# Patient Record
Sex: Male | Born: 1940 | Race: White | Hispanic: No | Marital: Married | Smoking: Former smoker
Health system: Southern US, Community
[De-identification: ages and names within clinical notes are randomized; demographics above are authoritative.]

## PROBLEM LIST (undated history)

## (undated) DIAGNOSIS — G4700 Insomnia, unspecified: Secondary | ICD-10-CM

## (undated) DIAGNOSIS — T7840XA Allergy, unspecified, initial encounter: Secondary | ICD-10-CM

## (undated) DIAGNOSIS — I10 Essential (primary) hypertension: Secondary | ICD-10-CM

## (undated) DIAGNOSIS — N529 Male erectile dysfunction, unspecified: Secondary | ICD-10-CM

## (undated) HISTORY — PX: TONSILLECTOMY AND ADENOIDECTOMY: SHX28

## (undated) HISTORY — DX: Essential (primary) hypertension: I10

## (undated) HISTORY — DX: Male erectile dysfunction, unspecified: N52.9

## (undated) HISTORY — DX: Insomnia, unspecified: G47.00

## (undated) HISTORY — DX: Allergy, unspecified, initial encounter: T78.40XA

---

## 1969-05-25 HISTORY — PX: VASECTOMY: SHX75

## 1990-03-17 ENCOUNTER — Encounter: Payer: Self-pay | Admitting: Internal Medicine

## 2001-08-19 ENCOUNTER — Ambulatory Visit (HOSPITAL_BASED_OUTPATIENT_CLINIC_OR_DEPARTMENT_OTHER): Admission: RE | Admit: 2001-08-19 | Discharge: 2001-08-19 | Payer: Self-pay

## 2005-03-19 ENCOUNTER — Encounter: Admission: RE | Admit: 2005-03-19 | Discharge: 2005-03-19 | Payer: Self-pay | Admitting: Occupational Medicine

## 2009-12-06 DIAGNOSIS — I10 Essential (primary) hypertension: Secondary | ICD-10-CM | POA: Insufficient documentation

## 2013-12-29 LAB — BASIC METABOLIC PANEL
BUN: 13 mg/dL (ref 4–21)
Creatinine: 1.2 mg/dL (ref <=1.3)
GLUCOSE: 111 mg/dL
Potassium: 4.7 mmol/L (ref 3.4–5.3)
Sodium: 137 mmol/L (ref 137–147)

## 2013-12-29 LAB — PSA: PSA: 2.7

## 2013-12-29 LAB — HEMOGLOBIN A1C: Hgb A1c MFr Bld: 6 % (ref 4.0–6.0)

## 2015-01-01 ENCOUNTER — Other Ambulatory Visit: Payer: Self-pay | Admitting: Family Medicine

## 2015-01-01 ENCOUNTER — Telehealth: Payer: Self-pay | Admitting: Family Medicine

## 2015-01-01 MED ORDER — METOPROLOL TARTRATE 25 MG PO TABS
12.5000 mg | ORAL_TABLET | Freq: Every day | ORAL | Status: DC
Start: 2015-01-01 — End: 2015-01-01

## 2015-01-01 MED ORDER — METOPROLOL TARTRATE 25 MG PO TABS: 12.5000 mg | ORAL_TABLET | Freq: Every day | ORAL | Status: DC

## 2015-01-01 NOTE — Telephone Encounter (Signed)
Metoprolol refill  [facility]

## 2015-01-09 DIAGNOSIS — G4700 Insomnia, unspecified: Secondary | ICD-10-CM | POA: Insufficient documentation

## 2015-01-09 DIAGNOSIS — J309 Allergic rhinitis, unspecified: Secondary | ICD-10-CM | POA: Insufficient documentation

## 2015-01-09 DIAGNOSIS — I7100 Dissection of unspecified site of aorta: Secondary | ICD-10-CM | POA: Insufficient documentation

## 2015-01-09 DIAGNOSIS — N529 Male erectile dysfunction, unspecified: Secondary | ICD-10-CM | POA: Insufficient documentation

## 2015-01-09 DIAGNOSIS — R7303 Prediabetes: Secondary | ICD-10-CM | POA: Insufficient documentation

## 2015-01-11 ENCOUNTER — Ambulatory Visit (INDEPENDENT_AMBULATORY_CARE_PROVIDER_SITE_OTHER): Payer: Medicare Other | Admitting: Family Medicine

## 2015-01-11 ENCOUNTER — Encounter: Payer: Self-pay | Admitting: Family Medicine

## 2015-01-11 VITALS — BP 120/62 | HR 42 | Temp 97.6°F | Resp 16 | Ht 68.75 in | Wt 185.0 lb

## 2015-01-11 DIAGNOSIS — I10 Essential (primary) hypertension: Secondary | ICD-10-CM

## 2015-01-11 DIAGNOSIS — Z1211 Encounter for screening for malignant neoplasm of colon: Secondary | ICD-10-CM | POA: Diagnosis not present

## 2015-01-11 DIAGNOSIS — G4700 Insomnia, unspecified: Secondary | ICD-10-CM | POA: Diagnosis not present

## 2015-01-11 DIAGNOSIS — R7309 Other abnormal glucose: Secondary | ICD-10-CM | POA: Diagnosis not present

## 2015-01-11 DIAGNOSIS — Z0000 Encounter for general adult medical examination without abnormal findings: Secondary | ICD-10-CM

## 2015-01-11 DIAGNOSIS — Z125 Encounter for screening for malignant neoplasm of prostate: Secondary | ICD-10-CM

## 2015-01-11 DIAGNOSIS — R7303 Prediabetes: Secondary | ICD-10-CM

## 2015-01-11 MED ORDER — LISINOPRIL 20 MG PO TABS: 20.0000 mg | ORAL_TABLET | Freq: Every day | ORAL | Status: DC

## 2015-01-11 MED ORDER — TRIAMTERENE-HCTZ 75-50 MG PO TABS: 1.0000 | ORAL_TABLET | Freq: Every day | ORAL | Status: DC

## 2015-01-11 MED ORDER — ALPRAZOLAM 0.5 MG PO TABS: 0.5000 mg | ORAL_TABLET | Freq: Every day | ORAL | Status: DC

## 2015-01-11 MED ORDER — METOPROLOL TARTRATE 25 MG PO TABS: 12.5000 mg | ORAL_TABLET | Freq: Every day | ORAL | Status: DC

## 2015-01-11 NOTE — Progress Notes (Signed)
Patient: Hunter Fernandez, Male    DOB: 1940-12-11, 74 y.o.   MRN: [id number] Visit Date: 01/11/2015  Today's Provider: Lelon Huh, MD   Chief Complaint  Patient presents with  . Physical  . Hypertension    follow up  . Hyperglycemia    follow up  . Erectile Dysfunction    follow up   Subjective:    Physical  Hunter Fernandez is a 74 y.o. male. He feels well. He reports exercising  Twice a week golfing. He reports he is sleeping well.  -----------------------------------------------------------  Hypertension, follow-up:  BP Readings from Last 3 Encounters:  01/11/15 120/62    He was last seen for hypertension 1 years ago.  BP at that visit was  124/76. Management changes since that visit include  Changing to Metoprolol tartrate due to cost. He reports excellent compliance with treatment. He is not having side effects.  He is exercising. He is not adherent to low salt diet.   Outside blood pressures are  Not being checked. He is experiencing none.  Patient denies chest pain, chest pressure/discomfort, claudication, dyspnea, exertional chest pressure/discomfort, fatigue, irregular heart beat, lower extremity edema, near-syncope, orthopnea, palpitations, paroxysmal nocturnal dyspnea, syncope and tachypnea.   Cardiovascular risk factors include advanced age (older than 57 for men, 61 for women), hypertension and male gender.  Use of agents associated with hypertension: NSAIDS.     Weight trend: stable Wt Readings from Last 3 Encounters:  01/11/15 185 lb (83.915 kg)    Current diet: in general, an "unhealthy" diet  ------------------------------------------------------------------------  Hyperglycemia, Follow-up:   Lab Results  Component Value Date   HGBA1C 6.0 12/29/2013    Last seen for for this1 years ago.  Management changes included  none. Current symptoms include none and have been stable.  Weight trend: stable Prior visit with dietician:  no Current diet: in general, an "unhealthy" diet Current exercise: golf  Pertinent Labs:    Component Value Date/Time   CREATININE 1.2 12/29/2013    Wt Readings from Last 3 Encounters:  01/11/15 185 lb (83.915 kg)      Review of Systems  Constitutional: Negative for fever, chills, appetite change and fatigue.  HENT: Negative for congestion, ear pain, hearing loss, nosebleeds and trouble swallowing.   Eyes: Negative for pain and visual disturbance.  Respiratory: Negative for cough, chest tightness and shortness of breath.   Cardiovascular: Negative for chest pain, palpitations and leg swelling.  Gastrointestinal: Negative for nausea, vomiting, abdominal pain, diarrhea, constipation and blood in stool.  Endocrine: Negative for polydipsia, polyphagia and polyuria.  Genitourinary: Negative for dysuria and flank pain.  Musculoskeletal: Negative for myalgias, back pain, joint swelling, arthralgias and neck stiffness.  Skin: Negative for color change, rash and wound.  Neurological: Negative for dizziness, tremors, seizures, speech difficulty, weakness, light-headedness and headaches.  Psychiatric/Behavioral: Negative for behavioral problems, confusion, sleep disturbance, dysphoric mood and decreased concentration. The patient is not nervous/anxious.   All other systems reviewed and are negative.   Social History   Social History  . Marital Status: Married    Spouse Name: N/A  . Number of Children: 1  . Years of Education: N/A   Occupational History  . Retired    Social History Main Topics  . Smoking status: Former Research scientist (life sciences)  . Smokeless tobacco: Never Used     Comment: quit smoking in 1991  . Alcohol Use: Yes     Comment: occasional alcohol use  . Drug Use: No  .  Sexual Activity: Not on file   Other Topics Concern  . Not on file   Social History Narrative    Patient Active Problem List   Diagnosis Date Noted  . Allergic rhinitis 01/09/2015  . Aortic dissection  01/09/2015  . 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 Insomnia 01/09/2015  . Pre-diabetes 01/09/2015  . Essential (primary) hypertension 12/06/2009    Past Surgical History  Procedure Laterality Date  . Tonsillectomy and adenoidectomy  1940  . Vasectomy  1971    His family history includes CVA in his sister; Dementia in his mother.    Previous Medications   ALPRAZOLAM (XANAX) 0.5 MG TABLET    Take 1 tablet by mouth at bedtime.   ASPIRIN EC 81 MG TABLET    Take 1 tablet by mouth daily. 1 Every day   DIPHENHYDRAMINE (BENADRYL) 25 MG TABLET    Take 25 mg by mouth 2 (two) times daily.   FLUTICASONE (FLONASE) 50 MCG/ACT NASAL SPRAY    Place 2 sprays into the nose daily.   LISINOPRIL (PRINIVIL,ZESTRIL) 20 MG TABLET    Take 20 mg by mouth daily.   METOPROLOL TARTRATE (LOPRESSOR) 25 MG TABLET    Take 0.5 tablets (12.5 mg total) by mouth daily.   SILDENAFIL (REVATIO) 20 MG TABLET    Take 2-3 tablets by mouth as needed. No more than 5 in a day   TRIAMTERENE-HYDROCHLOROTHIAZIDE (MAXZIDE) 75-50 MG PER TABLET    Take 1 tablet by mouth daily.    Patient Care Team: Birdie Sons, MD as PCP - General (Family Medicine)     Objective:   Vitals: BP 120/62 mmHg  Pulse 42  Temp(Src) 97.6 F (36.4 C) (Oral)  Resp 16  Ht 5' 8.75" (1.746 m)  Wt 185 lb (83.915 kg)  BMI 27.53 kg/m2  SpO2 95%  Physical Exam   General Appearance:    Alert, cooperative, no distress, appears stated age  Head:    Normocephalic, without obvious abnormality, atraumatic  Eyes:    PERRL, conjunctiva/corneas clear, EOM's intact, fundi    benign, both eyes       Ears:    Normal TM's and external ear canals, both ears  Nose:   Nares normal, septum midline, mucosa normal, no drainage   or sinus tenderness  Throat:   Lips, mucosa, and tongue normal; teeth and gums normal  Neck:   Supple, symmetrical, trachea midline, no adenopathy;       thyroid:  No enlargement/tenderness/nodules; no carotid    bruit or JVD  Back:     Symmetric, no curvature, ROM normal, no CVA tenderness  Lungs:     Clear to auscultation bilaterally, respirations unlabored  Chest wall:    No tenderness or deformity  Heart:    Regular rate and rhythm, S1 and S2 normal, no murmur, rub   or gallop  Abdomen:     Soft, non-tender, bowel sounds active all four quadrants,    no masses, no organomegaly  Genitalia:    deferred  Rectal:    deferred  Extremities:   Extremities normal, atraumatic, no cyanosis or edema  Pulses:   2+ and symmetric all extremities  Skin:   Skin color, texture, turgor normal, no rashes or lesions  Lymph nodes:   Cervical, supraclavicular, and axillary nodes normal  Neurologic:   CNII-XII intact. Normal strength, sensation and reflexes      throughout    Activities of Daily Living In your present state of health,  do you have any difficulty performing the following activities: 01/11/2015  Hearing? N  Vision? N  Difficulty concentrating or making decisions? N  Walking or climbing stairs? N  Dressing or bathing? N  Doing errands, shopping? N    Fall Risk Assessment Fall Risk  01/11/2015  Falls in the past year? No     Depression Screen PHQ 2/9 Scores 01/11/2015  PHQ - 2 Score 0    Cognitive Testing - 6-CIT  Correct? Score   What year is it? yes 0 0 or 4  What month is it? yes 0 0 or 3  Memorize:    Pia Mau,  40,  [place],      What time is it? (within 1 hour) yes 0 0 or 3  Count backwards from 20 yes 0 0, 2, or 4  Name the months of the year yes 0 0, 2, or 4  Repeat name & address above no 8 0, 2, 4, 6, 8, or 10       TOTAL SCORE  8/28   Interpretation:  Abnormal- .  Normal (0-7) Abnormal (8-28)     Audit-C Alcohol Use Screening  Question Answer Points  How often do you have alcoholic drink? 2-3 times weekly 3  How many drinks do you typically consume in a day? 1-2 0  How oftey will you drink 6 or more in a total? never 0  Total Score:  3   A score of  3 or more in women, and 4 or more in men indicates increased risk for alcohol abuse, EXCEPT if all of the points are from question 1.   Assessment & Plan:     Annual Physical  Reviewed patient's Family Medical History Reviewed and updated list of patient's medical providers Assessment of cognitive impairment was done Assessed patient's functional ability Established a written schedule for health screening [place] Completed and Reviewed  Exercise Activities and Dietary recommendations Goals    None      Immunization History  Administered Date(s) Administered  . Pneumococcal Conjugate-13 12/29/2013  . Pneumococcal Polysaccharide-23 09/28/2012    Health Maintenance  Topic Date Due  . COLONOSCOPY  10/29/1988  . ZOSTAVAX  10/30/1998  . INFLUENZA VACCINE  12/24/2014  . PNA vac Low Risk Adult  Completed      Discussed health benefits of physical activity, and encouraged him to engage in regular exercise appropriate for his age and condition.    ------------------------------------------------------------------------------------------------------------ 1. Annual physical exam Generally doing well.   2. Essential (primary) hypertension well controlled.Reduce metoprolol due to bradycardia  - EKG 12-Lead - Renal function panel - Lipid panel - TSH - metoprolol tartrate (LOPRESSOR) 25 MG tablet; Take 0.5 tablets (12.5 mg total) by mouth daily.  Dispense: 45 tablet; Refill: 3  3. Insomnia Well controlled on alprazolam - ALPRAZolam (XANAX) 0.5 MG tablet; Take 1 tablet (0.5 mg total) by mouth at bedtime.  Dispense: 90 tablet; Refill: 1  4. Pre-diabetes  - Hemoglobin A1c  5. Prostate cancer screening  - PSA  6. Colon cancer screening Declines referral for colonoscopy.  - Cologuard

## 2015-01-12 LAB — LIPID PANEL
Chol/HDL Ratio: 4.2 ratio units (ref 0.0–5.0)
Cholesterol, Total: 179 mg/dL (ref 100–199)
HDL: 43 mg/dL (ref >39)
LDL Calculated: 101 mg/dL — ABNORMAL HIGH (ref 0–99)
TRIGLYCERIDES: 177 mg/dL — AB (ref 0–149)
VLDL Cholesterol Cal: 35 mg/dL (ref 5–40)

## 2015-01-12 LAB — RENAL FUNCTION PANEL
Albumin: 4.4 g/dL (ref 3.5–4.8)
BUN/Creatinine Ratio: 8 — ABNORMAL LOW (ref 10–22)
BUN: 11 mg/dL (ref 8–27)
CALCIUM: 9.9 mg/dL (ref 8.6–10.2)
CO2: 25 mmol/L (ref 18–29)
Chloride: 97 mmol/L (ref 97–108)
Creatinine, Ser: 1.31 mg/dL — ABNORMAL HIGH (ref 0.76–1.27)
GFR calc Af Amer: 61 mL/min/{1.73_m2} (ref >59)
GFR calc non Af Amer: 53 mL/min/{1.73_m2} — ABNORMAL LOW (ref >59)
Glucose: 107 mg/dL — ABNORMAL HIGH (ref 65–99)
Phosphorus: 2.6 mg/dL (ref 2.5–4.5)
Potassium: 5.4 mmol/L — ABNORMAL HIGH (ref 3.5–5.2)
Sodium: 138 mmol/L (ref 134–144)

## 2015-01-12 LAB — PSA: Prostate Specific Ag, Serum: 2.7 ng/mL (ref 0.0–4.0)

## 2015-01-12 LAB — TSH: TSH: 1.61 u[IU]/mL (ref 0.450–4.500)

## 2015-01-12 LAB — HEMOGLOBIN A1C
ESTIMATED AVERAGE GLUCOSE: 123 mg/dL
Hgb A1c MFr Bld: 5.9 % — ABNORMAL HIGH (ref 4.8–5.6)

## 2015-01-22 LAB — COLOGUARD: Cologuard: NEGATIVE

## 2015-05-01 ENCOUNTER — Telehealth: Payer: Self-pay | Admitting: Family Medicine

## 2015-05-01 ENCOUNTER — Other Ambulatory Visit: Payer: Self-pay | Admitting: *Deleted

## 2015-05-01 NOTE — Telephone Encounter (Signed)
Please advise patient that Colo guard test was negative. Repeat in 3 years.

## 2015-05-01 NOTE — Telephone Encounter (Signed)
Patient's wife Webb Silversmith was notified of results. Expressed understanding.

## 2015-05-01 NOTE — Telephone Encounter (Signed)
-----   Message from April Loleta Chance, [place] sent at 04/30/2015  3:45 PM EST ----- Regarding: RE: Cologuard results Results are being fax.   ----- Message -----    From: Birdie Sons, MD    Sent: 04/30/2015  11:58 AM      To: Hardy Harcum Nurse Subject: Cologuard results                              This patient states he collected and mailed in Cologuard in August, but I can't find results anywhere in the EMR. Can you call Cologuard and see if they can send copy of results. Thanks.

## 2015-07-08 ENCOUNTER — Other Ambulatory Visit: Payer: Self-pay | Admitting: Family Medicine

## 2015-07-08 NOTE — Telephone Encounter (Signed)
Please call in alprazolam.  

## 2015-07-08 NOTE — Telephone Encounter (Signed)
Rx called in to pharmacy. 

## 2016-01-01 ENCOUNTER — Other Ambulatory Visit: Payer: Self-pay | Admitting: Family Medicine

## 2016-01-01 DIAGNOSIS — I10 Essential (primary) hypertension: Secondary | ICD-10-CM

## 2016-01-29 DIAGNOSIS — L03311 Cellulitis of abdominal wall: Secondary | ICD-10-CM | POA: Diagnosis not present

## 2016-01-29 DIAGNOSIS — K651 Peritoneal abscess: Secondary | ICD-10-CM | POA: Diagnosis not present

## 2016-02-20 ENCOUNTER — Other Ambulatory Visit: Payer: Self-pay | Admitting: Family Medicine

## 2016-02-20 NOTE — Telephone Encounter (Signed)
Refill request for triamterene-HTCZ 75-50 mg qd Last filled by MD on- 01/11/2015 #90 x3 Last Appt: 01/11/2015 Next Appt: none Please advise refill?

## 2016-03-11 DIAGNOSIS — Z23 Encounter for immunization: Secondary | ICD-10-CM | POA: Diagnosis not present

## 2016-03-18 ENCOUNTER — Ambulatory Visit (INDEPENDENT_AMBULATORY_CARE_PROVIDER_SITE_OTHER): Payer: Medicare Other | Admitting: Family Medicine

## 2016-03-18 ENCOUNTER — Encounter: Payer: Self-pay | Admitting: Family Medicine

## 2016-03-18 VITALS — BP 140/74 | HR 52 | Temp 97.7°F | Resp 16 | Wt 186.0 lb

## 2016-03-18 DIAGNOSIS — M5416 Radiculopathy, lumbar region: Secondary | ICD-10-CM

## 2016-03-18 MED ORDER — PREDNISONE 10 MG PO TABS: ORAL_TABLET | ORAL | 0 refills | Status: AC

## 2016-03-18 MED ORDER — GABAPENTIN 300 MG PO CAPS: 300.0000 mg | ORAL_CAPSULE | Freq: Every day | ORAL | 0 refills | Status: DC

## 2016-03-18 NOTE — Progress Notes (Signed)
Patient: Hunter Fernandez Male    DOB: 1940/07/03   75 y.o.   MRN: [id number] Visit Date: 03/18/2016  Today's Provider: Lelon Huh, MD   Chief Complaint  Patient presents with  . Hip Pain  . Leg Pain   Subjective:    Hip Pain   Incident onset: 3-4 weeks ago. There was no injury mechanism. The pain is present in the left leg and left hip. The quality of the pain is described as shooting (sharp). The pain has been intermittent since onset. Exacerbated by: standing after prolonged sitting. He has tried NSAIDs for the symptoms. The treatment provided moderate relief.  Leg Pain   Incident onset: 3-4 weeks ago. There was no injury mechanism. The pain is present in the left leg and left hip. The quality of the pain is described as shooting (sharp). The pain has been intermittent since onset. He has tried NSAIDs for the symptoms.  Has taken 4 x 200mg  ibuprofen with minimal relief.      No Known Allergies   Current Outpatient Prescriptions:  .  ALPRAZolam (XANAX) 0.5 MG tablet, TAKE ONE TABLET AT BEDTIME, Disp: 90 tablet, Rfl: 3 .  aspirin EC 81 MG tablet, Take 1 tablet by mouth daily. 1 Every day, Disp: , Rfl:  .  diphenhydrAMINE (BENADRYL) 25 MG tablet, Take 25 mg by mouth 2 (two) times daily., Disp: , Rfl:  .  fluticasone (FLONASE) 50 MCG/ACT nasal spray, Place 2 sprays into the nose daily., Disp: , Rfl:  .  lisinopril (PRINIVIL,ZESTRIL) 20 MG tablet, Take 1 tablet (20 mg total) by mouth daily., Disp: 90 tablet, Rfl: 3 .  metoprolol tartrate (LOPRESSOR) 25 MG tablet, TAKE ONE-HALF TABLET DAILY, Disp: 45 tablet, Rfl: 12 .  Omega-3 Fatty Acids (FISH OIL PO), Take 1 capsule by mouth daily., Disp: , Rfl:  .  sildenafil (REVATIO) 20 MG tablet, Take 2-3 tablets by mouth as needed. No more than 5 in a day, Disp: , Rfl:  .  triamterene-hydrochlorothiazide (MAXZIDE) 75-50 MG tablet, TAKE 1 TABLET EVERY DAY USUALLY IN THE MORNING, Disp: 90 tablet, Rfl: 0  Review of Systems  Constitutional:  Negative for appetite change, chills and fever.  Respiratory: Negative for chest tightness, shortness of breath and wheezing.   Cardiovascular: Negative for chest pain, palpitations and leg swelling.  Gastrointestinal: Negative for abdominal pain, nausea and vomiting.  Musculoskeletal: Positive for arthralgias (left hip and leg). Negative for back pain.    Social History  Substance Use Topics  . Smoking status: Former Research scientist (life sciences)  . Smokeless tobacco: Never Used     Comment: quit smoking in 1991  . Alcohol use Yes     Comment: occasional alcohol use   Objective:   BP 140/74 (BP Location: Left Arm, Patient Position: Sitting, Cuff Size: Large)   Pulse (!) 52   Temp 97.7 F (36.5 C) (Oral)   Resp 16   Wt 186 lb (84.4 kg)   SpO2 93% Comment: room air  BMI 27.67 kg/m   Physical Exam  General appearance: alert, well developed, well nourished, cooperative and in no distress Head: Normocephalic, without obvious abnormality, atraumatic Respiratory: Respirations even and unlabored, normal respiratory rate Extremities: No gross deformities Skin: Skin color, texture, turgor normal. No rashes seen  Psych: Appropriate mood and affect. Neurologic: Mental status: Alert, oriented to person, place, and time, thought content appropriate. MS: No tenderness of greater trochanter. FROM RIGHT hip with no pain. FROM left hip with pain on full  flexion. Pain is right hip is reproduced with palpation of lumbar spine.     Assessment & Plan:     1. Lumbar radiculopathy C/w L4 radiculopathy. Start prednisone and gabapentin. He is to call if not greatly improved over the next 3-4 days.  - predniSONE (DELTASONE) 10 MG tablet; 6 tablets for 2 days, then 5 for 2 days, then 4 for 2 days, then 3 for 2 days, then 2 for 2 days, then 1 for 2 days.  Dispense: 42 tablet; Refill: 0 - gabapentin (NEURONTIN) 300 MG capsule; Take 1 capsule (300 mg total) by mouth at bedtime.  Dispense: 30 capsule; Refill: 0         Lelon Huh, MD  [facility]

## 2016-03-28 ENCOUNTER — Other Ambulatory Visit: Payer: Self-pay | Admitting: Family Medicine

## 2016-03-31 ENCOUNTER — Other Ambulatory Visit: Payer: Self-pay

## 2016-03-31 MED ORDER — ALPRAZOLAM 0.5 MG PO TABS: 0.5000 mg | ORAL_TABLET | Freq: Every day | ORAL | 3 refills | Status: DC

## 2016-03-31 NOTE — Telephone Encounter (Signed)
Rx called in to pharmacy. 

## 2016-03-31 NOTE — Telephone Encounter (Signed)
Please call in alprazolam.  

## 2016-03-31 NOTE — Telephone Encounter (Signed)
Pharmacy requesting refills.

## 2016-04-03 ENCOUNTER — Encounter: Payer: Self-pay | Admitting: Family Medicine

## 2016-04-03 ENCOUNTER — Ambulatory Visit (INDEPENDENT_AMBULATORY_CARE_PROVIDER_SITE_OTHER): Payer: Medicare Other | Admitting: Family Medicine

## 2016-04-03 VITALS — BP 110/70 | HR 45 | Temp 97.7°F | Resp 16 | Wt 188.0 lb

## 2016-04-03 DIAGNOSIS — M79605 Pain in left leg: Secondary | ICD-10-CM

## 2016-04-03 MED ORDER — ALPRAZOLAM 0.5 MG PO TABS: 0.5000 mg | ORAL_TABLET | Freq: Every day | ORAL | 3 refills | Status: DC

## 2016-04-03 MED ORDER — KETOROLAC TROMETHAMINE 60 MG/2ML IM SOLN
60.0000 mg | Freq: Once | INTRAMUSCULAR | Status: AC
  Administered 2016-04-03: 60 mg via INTRAMUSCULAR

## 2016-04-03 MED ORDER — MELOXICAM 15 MG PO TABS: 15.0000 mg | ORAL_TABLET | Freq: Every day | ORAL | 0 refills | Status: AC

## 2016-04-03 NOTE — Progress Notes (Signed)
Patient: Hunter Fernandez Male    DOB: March 27, 1941   75 y.o.   MRN: [id number] Visit Date: 04/03/2016  Today's Provider: Lelon Huh, MD   Chief Complaint  Patient presents with  . Leg Pain   Subjective:    HPI  Patient was seen 03/18/2016 for left leg pain. Patient stated the prednisone that was prescribed did not help. He feels pain may have worsened since last visit. Is not painful when sitting or lying down, but severe when sitting up from sitting position and when taking steps. Pain is usually in left proximal, posterior thigh and often shoots up into buttocks or down to knee.   No Known Allergies   Current Outpatient Prescriptions:  .  ALPRAZolam (XANAX) 0.5 MG tablet, Take 1 tablet (0.5 mg total) by mouth at bedtime., Disp: 90 tablet, Rfl: 3 .  aspirin EC 81 MG tablet, Take 1 tablet by mouth daily. 1 Every day, Disp: , Rfl:  .  diphenhydrAMINE (BENADRYL) 25 MG tablet, Take 25 mg by mouth 2 (two) times daily., Disp: , Rfl:  .  fluticasone (FLONASE) 50 MCG/ACT nasal spray, Place 2 sprays into the nose daily., Disp: , Rfl:  .  gabapentin (NEURONTIN) 300 MG capsule, Take 1 capsule (300 mg total) by mouth at bedtime., Disp: 30 capsule, Rfl: 0 .  lisinopril (PRINIVIL,ZESTRIL) 20 MG tablet, TAKE 1 TABLET EVERY DAY, Disp: 90 tablet, Rfl: 3 .  metoprolol tartrate (LOPRESSOR) 25 MG tablet, TAKE ONE-HALF TABLET DAILY, Disp: 45 tablet, Rfl: 12 .  Omega-3 Fatty Acids (FISH OIL PO), Take 1 capsule by mouth daily., Disp: , Rfl:  .  sildenafil (REVATIO) 20 MG tablet, Take 2-3 tablets by mouth as needed. No more than 5 in a day, Disp: , Rfl:  .  triamterene-hydrochlorothiazide (MAXZIDE) 75-50 MG tablet, TAKE 1 TABLET EVERY DAY USUALLY IN THE MORNING, Disp: 90 tablet, Rfl: 0  Review of Systems  Constitutional: Negative for appetite change, chills and fever.  Respiratory: Negative for chest tightness, shortness of breath and wheezing.   Cardiovascular: Negative for chest pain and  palpitations.  Gastrointestinal: Negative for abdominal pain, nausea and vomiting.    Social History  Substance Use Topics  . Smoking status: Former Research scientist (life sciences)  . Smokeless tobacco: Never Used     Comment: quit smoking in 1991  . Alcohol use Yes     Comment: occasional alcohol use   Objective:   BP 110/70 (BP Location: Right Arm, Patient Position: Sitting, Cuff Size: Normal)   Pulse (!) 45   Temp 97.7 F (36.5 C) (Oral)   Resp 16   Wt 188 lb (85.3 kg)   SpO2 97%   BMI 27.97 kg/m   Physical Exam  MS: Tender over left buttocks and upper hamstrigs. No gross deformities. Pain reproduced by hip flexion against resistance.     Assessment & Plan:     1. Left leg pain Pain is in distribution of sciatic nerve, but he does have significant tenderness of posterior thigh muscles. Discussed obtaining xrays of thigh and back or orthopedic referral. Will try Toradol injection followed by scheduled NSAID over he week. He is interested in seeing orthopedist if not greatly improved over the weekend.   - meloxicam (MOBIC) 15 MG tablet; Take 1 tablet (15 mg total) by mouth daily. Take with food  Dispense: 30 tablet; Refill: 0 - ketorolac (TORADOL) injection 60 mg; Inject 2 mLs (60 mg total) into the muscle once.  Lelon Huh, MD  [facility]

## 2016-04-03 NOTE — Patient Instructions (Signed)
   Please call our office for a referral to orthopedist if your leg is not much better over the weekend.

## 2016-04-06 ENCOUNTER — Telehealth: Payer: Self-pay | Admitting: Family Medicine

## 2016-04-06 DIAGNOSIS — M79606 Pain in leg, unspecified: Principal | ICD-10-CM

## 2016-04-06 DIAGNOSIS — G8929 Other chronic pain: Secondary | ICD-10-CM

## 2016-04-06 NOTE — Telephone Encounter (Signed)
According to your office note from 11/10 patient was to call back office if not improving for referral. Please see last note. KW

## 2016-04-06 NOTE — Telephone Encounter (Signed)
Pt wife states pt came in Friday with left leg pain.  Pt wife states pt is still having a lot of leg pain.  Pt wife is asking what can ot take over the counter to help with the pain.  [id number]

## 2016-04-07 NOTE — Telephone Encounter (Signed)
Please refer orthopedics for chronic leg pain.

## 2016-04-09 DIAGNOSIS — M5416 Radiculopathy, lumbar region: Secondary | ICD-10-CM | POA: Diagnosis not present

## 2016-04-13 ENCOUNTER — Other Ambulatory Visit: Payer: Self-pay | Admitting: Orthopedic Surgery

## 2016-04-13 DIAGNOSIS — M5416 Radiculopathy, lumbar region: Secondary | ICD-10-CM

## 2016-04-24 ENCOUNTER — Other Ambulatory Visit: Payer: Self-pay | Admitting: Orthopedic Surgery

## 2016-04-24 DIAGNOSIS — M5416 Radiculopathy, lumbar region: Secondary | ICD-10-CM

## 2016-04-27 ENCOUNTER — Ambulatory Visit: Payer: Medicare Other

## 2016-05-06 ENCOUNTER — Ambulatory Visit: Payer: Medicare Other

## 2016-05-06 ENCOUNTER — Ambulatory Visit
Admission: RE | Admit: 2016-05-06 | Discharge: 2016-05-06 | Disposition: A | Discharge status: Home / Self Care | Payer: Medicare Other | Source: Ambulatory Visit | Attending: Orthopedic Surgery | Admitting: Orthopedic Surgery

## 2016-05-06 DIAGNOSIS — M7138 Other bursal cyst, other site: Secondary | ICD-10-CM | POA: Insufficient documentation

## 2016-05-06 DIAGNOSIS — M48061 Spinal stenosis, lumbar region without neurogenic claudication: Secondary | ICD-10-CM | POA: Insufficient documentation

## 2016-05-06 DIAGNOSIS — M545 Low back pain: Secondary | ICD-10-CM | POA: Diagnosis not present

## 2016-05-06 DIAGNOSIS — M5416 Radiculopathy, lumbar region: Secondary | ICD-10-CM | POA: Diagnosis not present

## 2016-05-06 DIAGNOSIS — M5126 Other intervertebral disc displacement, lumbar region: Secondary | ICD-10-CM | POA: Diagnosis not present

## 2016-05-06 DIAGNOSIS — M47896 Other spondylosis, lumbar region: Secondary | ICD-10-CM | POA: Diagnosis not present

## 2016-05-13 DIAGNOSIS — M48061 Spinal stenosis, lumbar region without neurogenic claudication: Secondary | ICD-10-CM | POA: Diagnosis not present

## 2016-05-13 DIAGNOSIS — M5416 Radiculopathy, lumbar region: Secondary | ICD-10-CM | POA: Diagnosis not present

## 2016-05-13 DIAGNOSIS — M4726 Other spondylosis with radiculopathy, lumbar region: Secondary | ICD-10-CM | POA: Diagnosis not present

## 2016-05-19 ENCOUNTER — Ambulatory Visit: Payer: Medicare Other

## 2016-09-04 ENCOUNTER — Other Ambulatory Visit: Payer: Self-pay | Admitting: Family Medicine

## 2016-09-04 NOTE — Telephone Encounter (Signed)
[facility] pharmacy faxed a request of the following medication.  Thanks CC  triamterene-hydrochlorothiazide (MAXZIDE) 75-50 MG tablet  Take 1 tablet by mouth every morning.

## 2016-09-06 MED ORDER — TRIAMTERENE-HCTZ 75-50 MG PO TABS: 1.0000 | ORAL_TABLET | Freq: Every day | ORAL | 0 refills | Status: DC

## 2016-10-30 ENCOUNTER — Encounter: Payer: Self-pay | Admitting: Family Medicine

## 2016-10-30 ENCOUNTER — Ambulatory Visit (INDEPENDENT_AMBULATORY_CARE_PROVIDER_SITE_OTHER): Payer: Medicare Other | Admitting: Family Medicine

## 2016-10-30 VITALS — BP 128/74 | HR 63 | Temp 98.1°F | Resp 16 | Ht 69.0 in | Wt 178.0 lb

## 2016-10-30 DIAGNOSIS — I10 Essential (primary) hypertension: Secondary | ICD-10-CM | POA: Diagnosis not present

## 2016-10-30 DIAGNOSIS — R7303 Prediabetes: Secondary | ICD-10-CM

## 2016-10-30 DIAGNOSIS — L989 Disorder of the skin and subcutaneous tissue, unspecified: Secondary | ICD-10-CM

## 2016-10-30 DIAGNOSIS — Z0000 Encounter for general adult medical examination without abnormal findings: Secondary | ICD-10-CM

## 2016-10-30 NOTE — Progress Notes (Signed)
Patient: Hunter Fernandez, Male    DOB: [birth year], 76 y.o.   MRN: [id number] Visit Date: 10/30/2016  Today's Provider: Lelon Huh, MD   Chief Complaint  Patient presents with  . Medicare Wellness  . Hypertension   Subjective:    Annual wellness visit Hunter Fernandez is a 76 y.o. male. He feels well. He reports exercising some/golfing. He reports he is sleeping well.  -----------------------------------------------------------   Hypertension, follow-up:  BP Readings from Last 3 Encounters:  10/30/16 128/74  04/03/16 110/70  03/18/16 140/74    He was last seen for hypertension 01/11/2015.  BP at that visit was 120/62. Management since that visit includes; reduced metoprolol due to bradycardia. Labs checked, kidney functions declined. Patient advised to increased water consumption.He reports good compliance with treatment. He is not having side effects. none He is exercising. He is adherent to low salt diet.   Outside blood pressures are not checking. He is experiencing none.  Patient denies none.   Cardiovascular risk factors include none.  Use of agents associated with hypertension: none.   ----------------------------------------------------------------   Insomnia From 01/11/2015-no changes.  Takes occasional alprazolam which he finds effective.   Pre-diabetes Lab Results  Component Value Date   HGBA1C 5.9 (H) 01/11/2015       Review of Systems  Constitutional: Negative for chills, diaphoresis and fever.  HENT: Negative for congestion, ear discharge, ear pain, hearing loss, nosebleeds, sore throat and tinnitus.   Eyes: Negative for photophobia, pain, discharge and redness.  Respiratory: Negative for cough, shortness of breath, wheezing and stridor.   Cardiovascular: Negative for chest pain, palpitations and leg swelling.  Gastrointestinal: Negative for abdominal pain, blood in stool, constipation, diarrhea, nausea and vomiting.  Endocrine: Negative for  polydipsia.  Genitourinary: Negative for dysuria, flank pain, frequency, hematuria and urgency.  Musculoskeletal: Negative for back pain, myalgias and neck pain.  Skin: Negative for rash.  Allergic/Immunologic: Negative for environmental allergies.  Neurological: Negative for dizziness, tremors, seizures, weakness and headaches.  Hematological: Does not bruise/bleed easily.  Psychiatric/Behavioral: Negative for hallucinations and suicidal ideas. The patient is not nervous/anxious.     Social History   Social History  . Marital status: Married    Spouse name: N/A  . Number of children: 1  . Years of education: N/A   Occupational History  . Retired    Social History Main Topics  . Smoking status: Former Research scientist (life sciences)  . Smokeless tobacco: Never Used     Comment: quit smoking in 1991  . Alcohol use Yes     Comment: occasional alcohol use  . Drug use: No  . Sexual activity: Not on file   Other Topics Concern  . Not on file   Social History Narrative  . No narrative on file    Past Medical History:  Diagnosis Date  . Allergy    allergic Rhinitis  . Erectile dysfunction   . Hypertension    essential  . Insomnia      Patient Active Problem List   Diagnosis Date Noted  . Allergic rhinitis 01/09/2015  . 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 Insomnia 01/09/2015  . Pre-diabetes 01/09/2015  . Essential (primary) hypertension 12/06/2009    Past Surgical History:  Procedure Laterality Date  . TONSILLECTOMY AND ADENOIDECTOMY  1940  . VASECTOMY  1971    His family history includes CVA in his sister; Dementia in his mother.  Current Outpatient Prescriptions:  .  ALPRAZolam (XANAX) 0.5 MG tablet, Take 1 tablet (0.5 mg total) by mouth at bedtime., Disp: 90 tablet, Rfl: 3 .  aspirin EC 81 MG tablet, Take 1 tablet by mouth daily. 1 Every day, Disp: , Rfl:  .  Chlorpheniramine Maleate (ALLERGY PO), Take by mouth.,  Disp: , Rfl:  .  Chlorpheniramine Maleate (ALLERGY RELIEF PO), Take by mouth 2 (two) times daily., Disp: , Rfl:  .  diphenhydrAMINE (BENADRYL) 25 MG tablet, Take 25 mg by mouth 2 (two) times daily., Disp: , Rfl:  .  IBUPROFEN PO, Take by mouth as needed., Disp: , Rfl:  .  lisinopril (PRINIVIL,ZESTRIL) 20 MG tablet, TAKE 1 TABLET EVERY DAY, Disp: 90 tablet, Rfl: 3 .  metoprolol tartrate (LOPRESSOR) 25 MG tablet, TAKE ONE-HALF TABLET DAILY, Disp: 45 tablet, Rfl: 12 .  Omega-3 Fatty Acids (FISH OIL PO), Take 1 capsule by mouth daily., Disp: , Rfl:  .  sildenafil (REVATIO) 20 MG tablet, Take 2-3 tablets by mouth as needed. No more than 5 in a day, Disp: , Rfl:  .  triamterene-hydrochlorothiazide (MAXZIDE) 75-50 MG tablet, Take 1 tablet by mouth daily., Disp: 90 tablet, Rfl: 0  Patient Care Team: Birdie Sons, MD as PCP - General (Family Medicine)     Objective:   Vitals: BP 128/74 (BP Location: Right Arm, Patient Position: Sitting, Cuff Size: Large)   Pulse 63   Temp 98.1 F (36.7 C) (Oral)   Resp 16   Ht 5\' 9"  (1.753 m)   Wt 178 lb (80.7 kg)   SpO2 97%   BMI 26.29 kg/m   Physical Exam   General Appearance:    Alert, cooperative, no distress, appears stated age  Head:    Normocephalic, without obvious abnormality, atraumatic  Eyes:    PERRL, conjunctiva/corneas clear, EOM's intact, fundi    benign, both eyes       Ears:    Normal TM's and external ear canals, both ears  Nose:   Nares normal, septum midline, mucosa normal, no drainage   or sinus tenderness  Throat:   Lips, mucosa, and tongue normal; teeth and gums normal  Neck:   Supple, symmetrical, trachea midline, no adenopathy;       thyroid:  No enlargement/tenderness/nodules; no carotid   bruit or JVD  Back:     Symmetric, no curvature, ROM normal, no CVA tenderness  Lungs:     Clear to auscultation bilaterally, respirations unlabored  Chest wall:    No tenderness or deformity  Heart:    Regular rate and rhythm, S1  and S2 normal, no murmur, rub   or gallop  Abdomen:     Soft, non-tender, bowel sounds active all four quadrants,    no masses, no organomegaly  Genitalia:    deferred  Rectal:    deferred  Extremities:   Extremities normal, atraumatic, no cyanosis or edema  Pulses:   2+ and symmetric all extremities  Skin:   Grape sized hemangioma of lower anterior chest wall.   Lymph nodes:   Cervical, supraclavicular, and axillary nodes normal  Neurologic:   CNII-XII intact. Normal strength, sensation and reflexes      throughout    Activities of Daily Living In your present state of health, do you have any difficulty performing the following activities: 10/30/2016  Hearing? N  Vision? N  Difficulty concentrating or making decisions? N  Walking or climbing stairs? N  Dressing or bathing? N  Doing errands,  shopping? N  Some recent data might be hidden    Fall Risk Assessment Fall Risk  10/30/2016 03/18/2016 01/11/2015  Falls in the past year? No No No     Depression Screen PHQ 2/9 Scores 10/30/2016 03/18/2016 01/11/2015  PHQ - 2 Score 0 0 0  PHQ- 9 Score 0 - -    Cognitive Testing - 6-CIT  Correct? Score   What year is it? yes 0 0 or 4  What month is it? yes 0 0 or 3  Memorize:    Pia Mau,  40,  [place],      What time is it? (within 1 hour) yes 0 0 or 3  Count backwards from 20 yes 0 0, 2, or 4  Name the months of the year yes 0 0, 2, or 4  Repeat name & address above yes 0 0, 2, 4, 6, 8, or 10       TOTAL SCORE  0/28   Interpretation:  Normal  Normal (0-7) Abnormal (8-28)    Audit-C Alcohol Use Screening  Question Answer Points  How often do you have alcoholic drink? 1 or more times weekly 2  On days you do drink alcohol, how many drinks do you typically consume? 1-2 1  How oftey will you drink 6 or more in a total? never 0  Total Score:  3   A score of 3 or more in women, and 4 or more in men indicates increased risk for alcohol abuse, EXCEPT if all of the  points are from question 1.     Assessment & Plan:     Annual Wellness Visit  Reviewed patient's Family Medical History Reviewed and updated list of patient's medical providers Assessment of cognitive impairment was done Assessed patient's functional ability Established a written schedule for health screening [place] Completed and Reviewed  Exercise Activities and Dietary recommendations Goals    None      Immunization History  Administered Date(s) Administered  . Pneumococcal Conjugate-13 12/29/2013  . Pneumococcal Polysaccharide-23 09/28/2012  . Zoster 02/26/2015    Health Maintenance  Topic Date Due  . TETANUS/TDAP  10/29/1957  . INFLUENZA VACCINE  12/23/2016  . PNA vac Low Risk Adult  Completed     Discussed health benefits of physical activity, and encouraged him to engage in regular exercise appropriate for his age and condition.    --------------------------------------------------------------------------  1. Annual physical exam  - EKG 12-Lead  2. Skin lesion of chest wall  - Ambulatory referral to Dermatology  3. Essential (primary) hypertension Well controlled.  Continue current medications.   - Comprehensive metabolic panel - Lipid panel  4. Pre-diabetes  - Hemoglobin A1c    Lelon Huh, MD  [facility]

## 2016-10-31 LAB — COMPREHENSIVE METABOLIC PANEL
ALBUMIN: 4.6 g/dL (ref 3.5–4.8)
ALT: 30 IU/L (ref 0–44)
AST: 23 IU/L (ref 0–40)
Albumin/Globulin Ratio: 1.6 (ref 1.2–2.2)
Alkaline Phosphatase: 58 IU/L (ref 39–117)
BUN/Creatinine Ratio: 9 — ABNORMAL LOW (ref 10–24)
BUN: 11 mg/dL (ref 8–27)
Bilirubin Total: 0.6 mg/dL (ref 0.0–1.2)
CO2: 27 mmol/L (ref 18–29)
CREATININE: 1.27 mg/dL (ref 0.76–1.27)
Calcium: 10.2 mg/dL (ref 8.6–10.2)
Chloride: 96 mmol/L (ref 96–106)
GFR calc Af Amer: 62 mL/min/{1.73_m2} (ref >59)
GFR, EST NON AFRICAN AMERICAN: 54 mL/min/{1.73_m2} — AB (ref >59)
Globulin, Total: 2.9 g/dL (ref 1.5–4.5)
Glucose: 104 mg/dL — ABNORMAL HIGH (ref 65–99)
Potassium: 4.7 mmol/L (ref 3.5–5.2)
Sodium: 136 mmol/L (ref 134–144)
Total Protein: 7.5 g/dL (ref 6.0–8.5)

## 2016-10-31 LAB — LIPID PANEL
Chol/HDL Ratio: 3.7 ratio (ref 0.0–5.0)
Cholesterol, Total: 188 mg/dL (ref 100–199)
HDL: 51 mg/dL (ref >39)
LDL CALC: 107 mg/dL — AB (ref 0–99)
Triglycerides: 152 mg/dL — ABNORMAL HIGH (ref 0–149)
VLDL Cholesterol Cal: 30 mg/dL (ref 5–40)

## 2016-10-31 LAB — HEMOGLOBIN A1C
Est. average glucose Bld gHb Est-mCnc: 123 mg/dL
Hgb A1c MFr Bld: 5.9 % — ABNORMAL HIGH (ref 4.8–5.6)

## 2016-11-11 ENCOUNTER — Other Ambulatory Visit: Payer: Self-pay | Admitting: Family Medicine

## 2016-11-11 NOTE — Telephone Encounter (Signed)
Rx called in to pharmacy. 

## 2016-11-11 NOTE — Telephone Encounter (Signed)
Please call in alprazolam.  

## 2016-12-01 DIAGNOSIS — C44511 Basal cell carcinoma of skin of breast: Secondary | ICD-10-CM | POA: Diagnosis not present

## 2016-12-28 DIAGNOSIS — Z85828 Personal history of other malignant neoplasm of skin: Secondary | ICD-10-CM | POA: Diagnosis not present

## 2016-12-28 DIAGNOSIS — C44212 Basal cell carcinoma of skin of right ear and external auricular canal: Secondary | ICD-10-CM | POA: Diagnosis not present

## 2016-12-28 DIAGNOSIS — Z08 Encounter for follow-up examination after completed treatment for malignant neoplasm: Secondary | ICD-10-CM | POA: Diagnosis not present

## 2017-03-10 ENCOUNTER — Other Ambulatory Visit: Payer: Self-pay | Admitting: Family Medicine

## 2017-03-16 DIAGNOSIS — Z23 Encounter for immunization: Secondary | ICD-10-CM | POA: Diagnosis not present

## 2017-03-22 ENCOUNTER — Other Ambulatory Visit: Payer: Self-pay | Admitting: Family Medicine

## 2017-03-22 DIAGNOSIS — I10 Essential (primary) hypertension: Secondary | ICD-10-CM

## 2017-03-26 DIAGNOSIS — Z08 Encounter for follow-up examination after completed treatment for malignant neoplasm: Secondary | ICD-10-CM | POA: Diagnosis not present

## 2017-03-26 DIAGNOSIS — L905 Scar conditions and fibrosis of skin: Secondary | ICD-10-CM | POA: Diagnosis not present

## 2017-03-26 DIAGNOSIS — Z85828 Personal history of other malignant neoplasm of skin: Secondary | ICD-10-CM | POA: Diagnosis not present

## 2017-05-11 ENCOUNTER — Other Ambulatory Visit: Payer: Self-pay | Admitting: Family Medicine

## 2017-06-28 ENCOUNTER — Other Ambulatory Visit: Payer: Self-pay | Admitting: Family Medicine

## 2017-06-28 NOTE — Telephone Encounter (Signed)
Pharmacy requesting refills. Thanks!  

## 2017-07-21 ENCOUNTER — Other Ambulatory Visit: Payer: Self-pay | Admitting: *Deleted

## 2017-07-21 MED ORDER — LISINOPRIL 20 MG PO TABS: 20.0000 mg | ORAL_TABLET | Freq: Every day | ORAL | 4 refills | Status: DC

## 2017-07-21 NOTE — Telephone Encounter (Signed)
Requesting 90 day supply.

## 2017-08-10 IMAGING — MR MR LUMBAR SPINE W/O CM
5 series · 36 of 48 positions shown · non-contrast
Comparison: None.

CLINICAL DATA: Low back pain with pain, numbness and paresthesias
in the left leg to the calf for 6-8 weeks. No known injury.

EXAM:
MRI LUMBAR SPINE WITHOUT CONTRAST
TECHNIQUE: Multiplanar, multisequence MR imaging of the lumbar spine was
performed. No intravenous contrast was administered.

[Series 2: T2 · sagittal · 4.0mm · 0.81mm/px · 6 of 15 slices shown (1 of 2)]
[im 1/15]
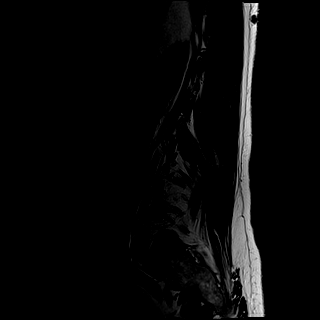
[im 3/15]
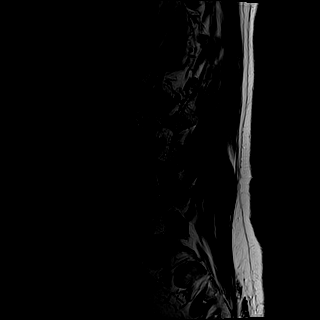
[im 6/15]
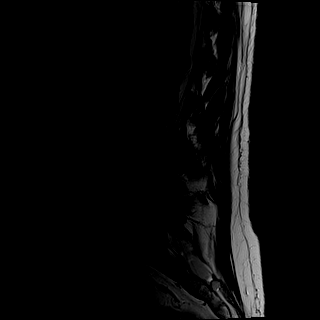
[im 9/15]
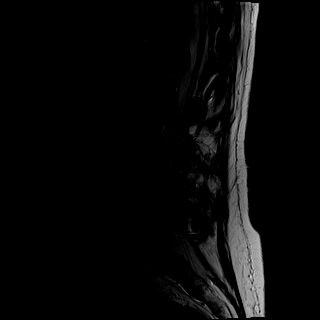
[im 12/15]
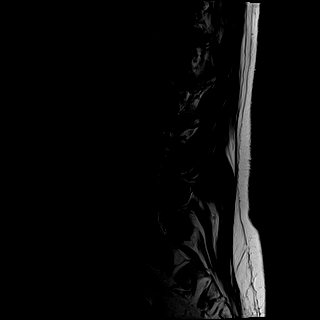
[im 15/15]
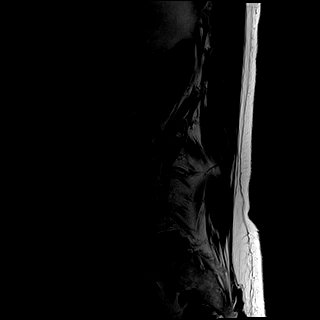

[Series 3: T1 · sagittal · 4.0mm · 0.81mm/px · 6 of 15 slices shown (1 of 2)]
[im 1/15]
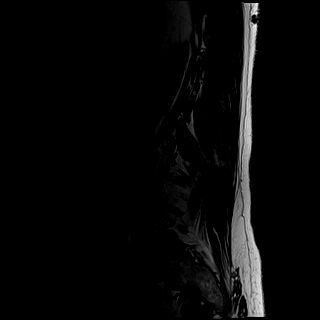
[im 3/15]
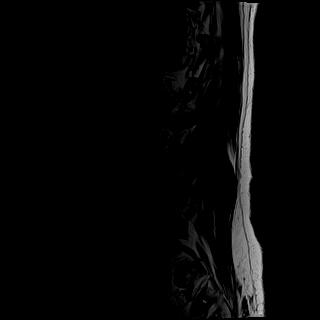
[im 6/15]
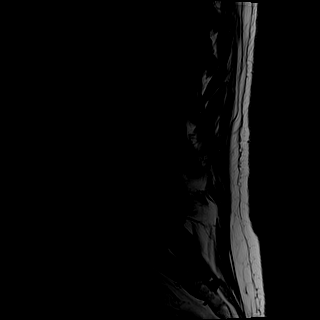
[im 9/15]
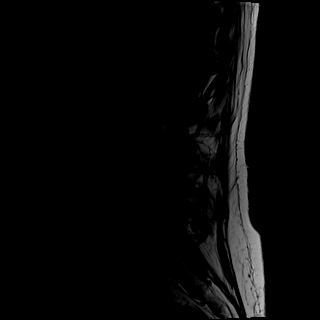
[im 12/15]
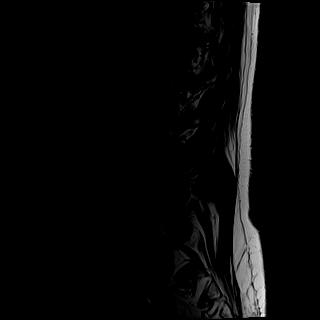
[im 15/15]
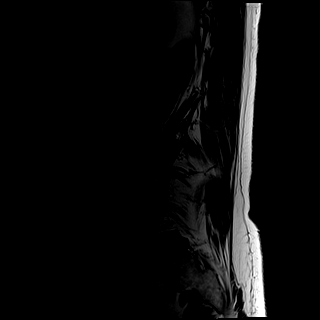

[Series 4: STIR · sagittal · 4.0mm · 1.02mm/px · 6 of 15 slices shown]
[im 1/15]
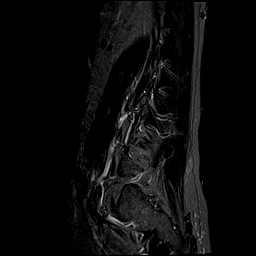
[im 3/15]
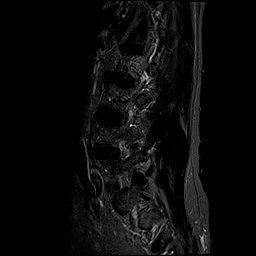
[im 6/15]
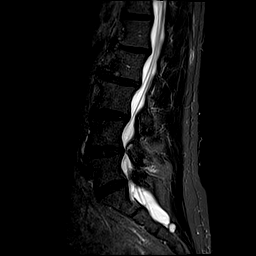
[im 9/15]
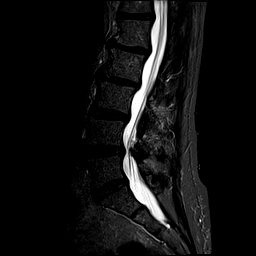
[im 12/15]
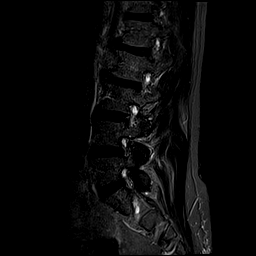
[im 15/15]
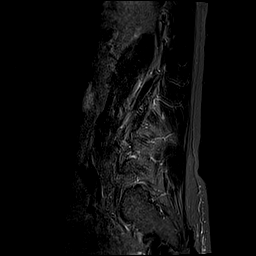

[Series 5: T2 · axial · 4.0mm · 0.78mm/px · z∈[-25,+183]mm · 9 of 35 slices shown (2 of 2)]
[im 1/35]
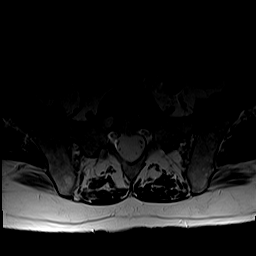
[im 5/35]
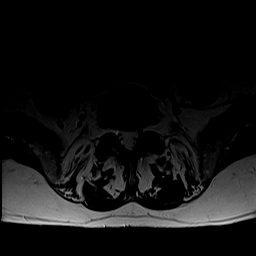
[im 10/35]
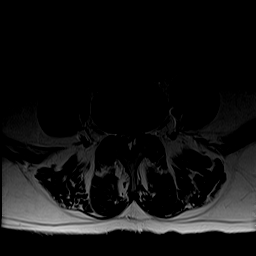
[im 15/35]
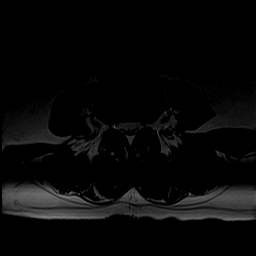
[im 18/35]
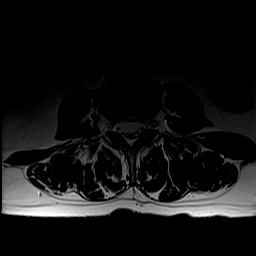
[im 20/35]
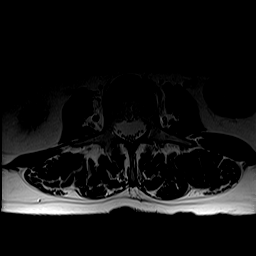
[im 25/35]
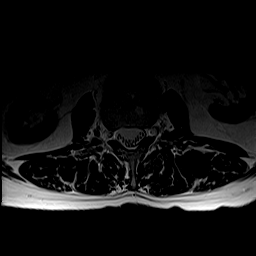
[im 30/35]
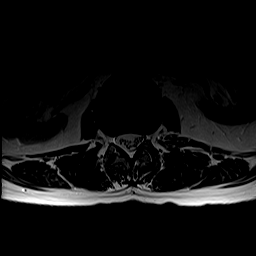
[im 35/35]
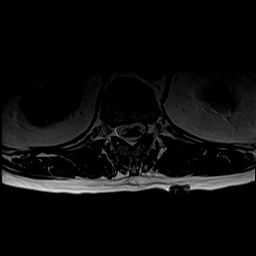

[Series 6: T1 · axial · 4.0mm · 0.39mm/px · z∈[-25,+183]mm · 9 of 35 slices shown (2 of 2)]
[im 1/35]
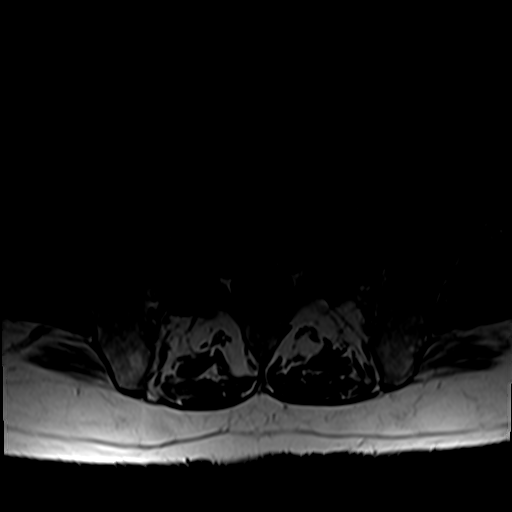
[im 5/35]
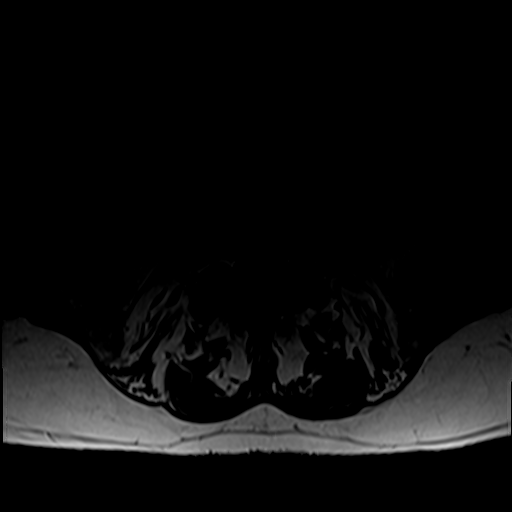
[im 10/35]
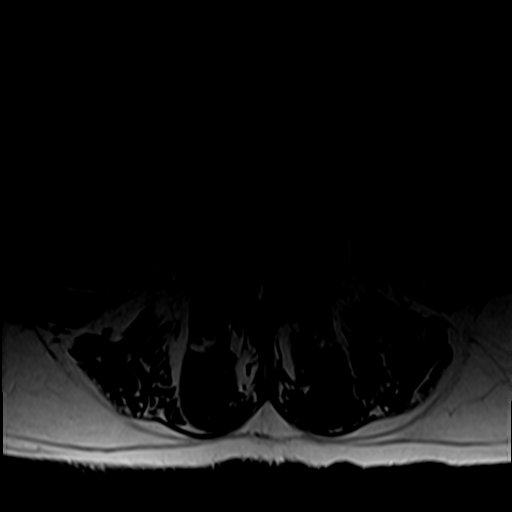
[im 15/35]
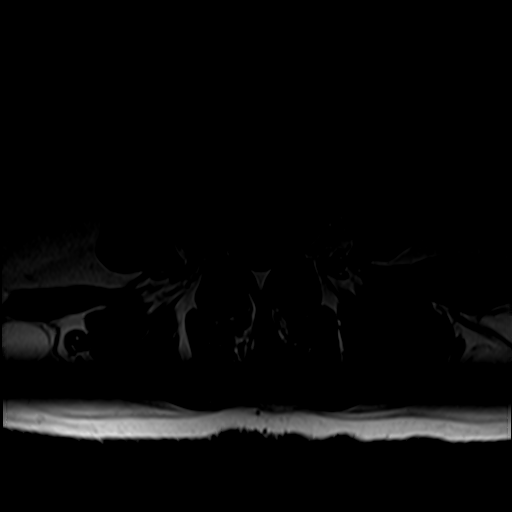
[im 18/35]
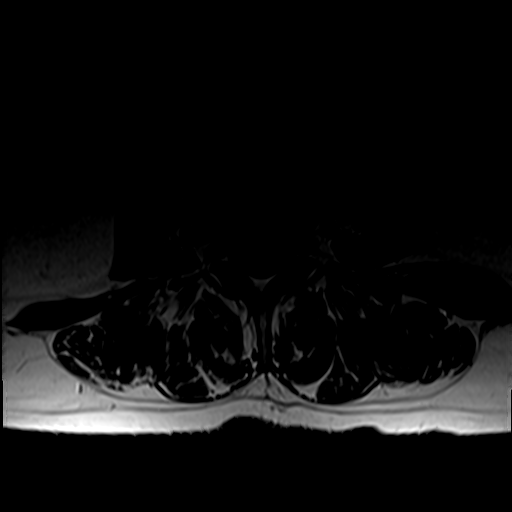
[im 20/35]
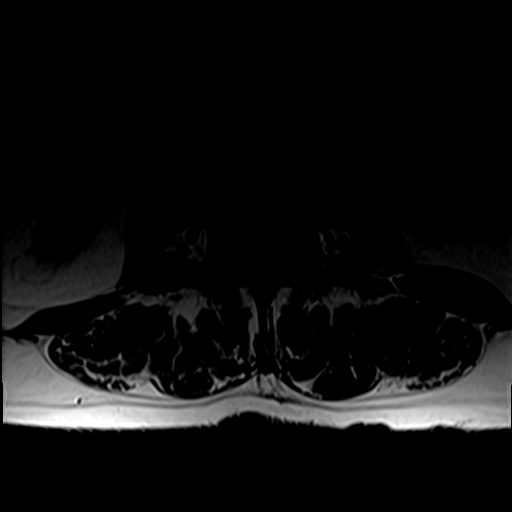
[im 25/35]
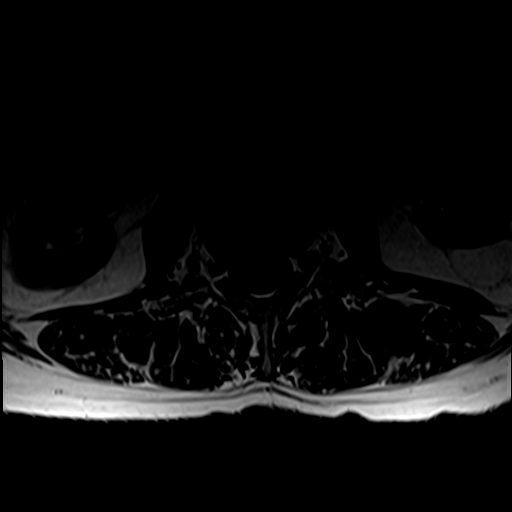
[im 30/35]
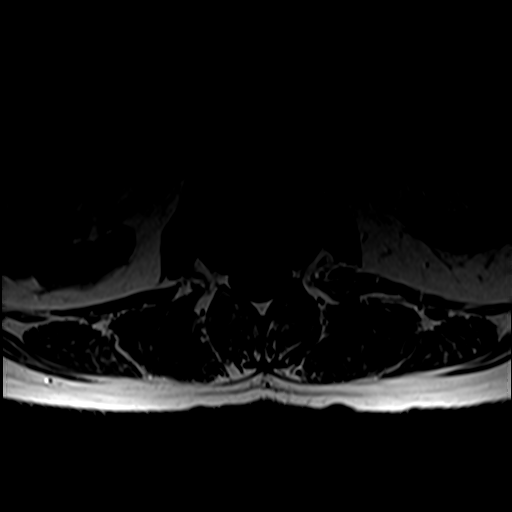
[im 35/35]
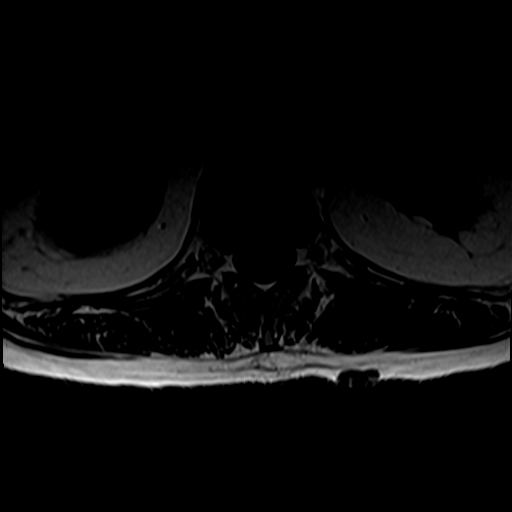

[36 of 48 positions shown; findings below may reference images not displayed]

FINDINGS: Segmentation:  Standard.

Alignment: Trace retrolisthesis L2 on L3 is noted. Mild convex right
scoliosis is seen.

Vertebrae: No fracture or worrisome lesion. A few small hemangiomas
are identified.

Conus medullaris: Extends to the T12-L1 level and appears normal.

Paraspinal and other soft tissues: Negative.

Disc levels:

T12-L1: Shallow right paracentral protrusion and annular fissure
without central canal or foraminal stenosis.

L1-2:  Mild disc bulge without central canal or foraminal narrowing.

L2-3: Small right subarticular recess protrusion without stenosis.
The foramina are widely patent.

L3-4: Shallow disc bulge and mild-to-moderate facet degenerative
change. Annular fissure and shallow extraforaminal protrusion are
seen on the right. Disc contacts the exited right L3 root without
compression or displacement.

L4-5: There is a disc bulge with some ligamentum flavum thickening
and bilateral facet degenerative change. A synovial cyst off the
medial margin of the left facet joint measures 1.3 cm craniocaudal
by 1 cm AP by 0.6 cm transverse. The cyst encroaches on the
descending left L5 root in conjunction with a disc bulge. Milder
degree of subarticular recess narrowing is present on the right.
There is moderately severe central canal stenosis overall at this
level. The foramina are open.

L5-S1: Moderate facet degenerative change. Shallow left paracentral
protrusion is seen. The central canal and foramina are open.
IMPRESSION: Spondylosis worst at L4-5 where there is moderately severe central
canal stenosis. Left worse than right subarticular recess narrowing
is present. Prominent synovial cyst off the left facet joint
contributes to encroachment on the descending left L5 root.

Small extraforaminal protrusion and annular fissure on the right at
L3-4. Disc contacts the exited right L3 root without compression
displacement.

## 2018-02-08 ENCOUNTER — Other Ambulatory Visit: Payer: Self-pay | Admitting: Family Medicine

## 2018-03-03 ENCOUNTER — Other Ambulatory Visit: Payer: Self-pay | Admitting: Family Medicine

## 2018-03-03 DIAGNOSIS — I10 Essential (primary) hypertension: Secondary | ICD-10-CM

## 2018-03-03 MED ORDER — METOPROLOL TARTRATE 25 MG PO TABS: 12.5000 mg | ORAL_TABLET | Freq: Every day | ORAL | 3 refills | Status: DC

## 2018-03-03 NOTE — Telephone Encounter (Signed)
[facility] faxed refill request for the following medications:   metoprolol tartrate (LOPRESSOR) 25 MG tablet  Please advise.  

## 2018-03-03 NOTE — Telephone Encounter (Signed)
Please review. Thanks!  

## 2018-03-17 ENCOUNTER — Other Ambulatory Visit: Payer: Self-pay | Admitting: Family Medicine

## 2018-04-04 ENCOUNTER — Ambulatory Visit: Payer: Medicare Other | Admitting: Family Medicine

## 2018-04-07 ENCOUNTER — Ambulatory Visit (INDEPENDENT_AMBULATORY_CARE_PROVIDER_SITE_OTHER): Payer: Medicare Other | Admitting: Family Medicine

## 2018-04-07 DIAGNOSIS — Z23 Encounter for immunization: Secondary | ICD-10-CM

## 2018-04-08 ENCOUNTER — Ambulatory Visit: Payer: Medicare Other | Admitting: Family Medicine

## 2018-06-13 ENCOUNTER — Other Ambulatory Visit (HOSPITAL_COMMUNITY)
Admission: RE | Admit: 2018-06-13 | Discharge: 2018-06-13 | Disposition: A | Discharge status: Home / Self Care | Payer: Medicare Other | Source: Ambulatory Visit | Attending: Family Medicine | Admitting: Family Medicine

## 2018-06-13 ENCOUNTER — Ambulatory Visit: Payer: Medicare Other | Admitting: Family Medicine

## 2018-06-13 ENCOUNTER — Encounter: Payer: Self-pay | Admitting: Family Medicine

## 2018-06-13 VITALS — BP 117/70 | HR 52 | Temp 97.9°F | Resp 16 | Wt 170.0 lb

## 2018-06-13 DIAGNOSIS — L821 Other seborrheic keratosis: Secondary | ICD-10-CM | POA: Diagnosis not present

## 2018-06-13 DIAGNOSIS — D485 Neoplasm of uncertain behavior of skin: Secondary | ICD-10-CM

## 2018-06-13 NOTE — Patient Instructions (Signed)
Skin Tag, Adult    A skin tag (acrochordon) is a soft, extra growth of skin. Most skin tags are flesh-colored and rarely bigger than a pencil eraser. They commonly form near areas where there are folds in the skin, such as the armpit or groin. Skin tags are not dangerous, and they do not spread from person to person (are not contagious).  You may have one skin tag or several. Skin tags do not require treatment. However, your health care provider may recommend removal of a skin tag if it:   Gets irritated from clothing.   Bleeds.   Is visible and unsightly.  Your health care provider can remove skin tags with a simple surgical procedure or a procedure that involves freezing the skin tag.  Follow these instructions at home:   Watch for any changes in your skin tag. A normal skin tag does not require any other special care at home.   Take over-the-counter and prescription medicines only as told by your health care provider.   Keep all follow-up visits as told by your health care provider. This is important.  Contact a health care provider if:   You have a skin tag that:  ? Becomes painful.  ? Changes color.  ? Bleeds.  ? Swells.   You develop more skin tags.  This information is not intended to replace advice given to you by your health care provider. Make sure you discuss any questions you have with your health care provider.  Document Released: 05/26/2015 Document Revised: 01/05/2016 Document Reviewed: 05/26/2015  Elsevier Interactive Patient Education  2019 [employer]vier [employer]

## 2018-06-13 NOTE — Progress Notes (Signed)
Patient: Hunter Fernandez Male    DOB: 1940/12/20   78 y.o.   MRN: [id number] Visit Date: 06/13/2018  Today's Provider: Lavon Paganini, MD   Chief Complaint  Patient presents with  . Skin Problem   Subjective:    HPI Patient here today c/o a lesion on the back of his upper back right leg. Patient reports that lesion has been bothersome in the last month, but has been pressent for >1 yr. Patient reports he did try to remove it on his own by tying a string around the base.  He would like it removed.   No Known Allergies   Current Outpatient Medications:  .  ALPRAZolam (XANAX) 0.5 MG tablet, TAKE ONE TABLET BY MOUTH AT BEDTIME, Disp: 90 tablet, Rfl: 1 .  aspirin EC 81 MG tablet, Take 1 tablet by mouth daily. 1 Every day, Disp: , Rfl:  .  Chlorpheniramine Maleate (ALLERGY RELIEF PO), Take by mouth 2 (two) times daily., Disp: , Rfl:  .  diphenhydrAMINE (BENADRYL) 25 MG tablet, Take 25 mg by mouth 2 (two) times daily., Disp: , Rfl:  .  IBUPROFEN PO, Take by mouth as needed., Disp: , Rfl:  .  lisinopril (PRINIVIL,ZESTRIL) 20 MG tablet, Take 1 tablet (20 mg total) by mouth daily., Disp: 90 tablet, Rfl: 4 .  metoprolol tartrate (LOPRESSOR) 25 MG tablet, Take 0.5 tablets (12.5 mg total) by mouth daily., Disp: 45 tablet, Rfl: 3 .  Omega-3 Fatty Acids (FISH OIL PO), Take 1 capsule by mouth daily., Disp: , Rfl:  .  sildenafil (REVATIO) 20 MG tablet, Take 2-3 tablets by mouth as needed. No more than 5 in a day, Disp: , Rfl:  .  triamterene-hydrochlorothiazide (MAXZIDE) 75-50 MG tablet, TAKE ONE TABLET BY MOUTH EVERY DAY, Disp: 90 tablet, Rfl: 3  Review of Systems  Constitutional: Negative.   Cardiovascular: Negative.   Skin: Positive for rash.    Social History   Tobacco Use  . Smoking status: Former Research scientist (life sciences)  . Smokeless tobacco: Never Used  . Tobacco comment: quit smoking in 1991  Substance Use Topics  . Alcohol use: Yes    Comment: occasional alcohol use      Objective:   BP  117/70 (BP Location: Left Arm, Patient Position: Sitting, Cuff Size: Normal)   Pulse (!) 52   Temp 97.9 F (36.6 C) (Oral)   Resp 16   Wt 170 lb (77.1 kg)   BMI 25.10 kg/m  Vitals:   06/13/18 0958  BP: 117/70  Pulse: (!) 52  Resp: 16  Temp: 97.9 F (36.6 C)  TempSrc: Oral  Weight: 170 lb (77.1 kg)     Physical Exam Vitals signs reviewed.  Constitutional:      Appearance: Normal appearance.  HENT:     Head: Normocephalic and atraumatic.  Eyes:     General: No scleral icterus.    Conjunctiva/sclera: Conjunctivae normal.  Cardiovascular:     Rate and Rhythm: Normal rate and regular rhythm.  Pulmonary:     Effort: Pulmonary effort is normal. No respiratory distress.  Musculoskeletal:     Right lower leg: No edema.     Left lower leg: No edema.  Skin:    General: Skin is dry.     Capillary Refill: Capillary refill takes less than 2 seconds.     Coloration: Skin is not pale.     Findings: No rash.     Comments: 1-1.5cm pedunculated skin lesion with some hyperpigmentation On  proximal posterior thigh  Neurological:     Mental Status: He is alert and oriented to person, place, and time. Mental status is at baseline.  Psychiatric:        Mood and Affect: Mood normal.        Behavior: Behavior normal.        Assessment & Plan   1. Neoplasm of uncertain behavior of skin - likely a skin tag, but given size, tenderness, recent change, and partial hyperpigmentation, will send for pathology - discussed risks and benefits of procedure and verbal consent obtained. Area cleaned in usual sterile fashion, 2cc of lidocaine with epi injected into base of lesion, lesion elevated and base clipped with sterile scissors. Patient tolerated well with no bleeding.  Area covered and patient advised to keep clean.  Discussed return precautions, including signs of infection - Dermatology pathology    Return if symptoms worsen or fail to improve.   The entirety of the information  documented in the History of Present Illness, Review of Systems and Physical Exam were personally obtained by me. Portions of this information were initially documented by Lynford Humphrey and Wilcox, CMA and reviewed by me for thoroughness and accuracy.    Virginia Crews, MD, MPH [facility] 06/13/2018 12:36 PM

## 2018-06-15 ENCOUNTER — Telehealth: Payer: Self-pay

## 2018-06-15 NOTE — Telephone Encounter (Signed)
Pt returned missed call.  Please call pt back on cell phone.  Thanks, [employer]

## 2018-06-15 NOTE — Telephone Encounter (Signed)
-----   Message from Virginia Crews, MD sent at 06/15/2018  3:27 PM EST ----- Benign skin lesion.  No further intervention necessary

## 2018-06-15 NOTE — Telephone Encounter (Signed)
LVMTRC 

## 2018-06-17 NOTE — Telephone Encounter (Signed)
Pt returned missed call.  Please call pt back on cell phone.  Thanks, [employer]

## 2018-06-17 NOTE — Telephone Encounter (Signed)
Left patient a message advising him that pathology report was normal, and if he had any further questions to call back.

## 2018-06-17 NOTE — Telephone Encounter (Signed)
LMTCB

## 2018-07-15 ENCOUNTER — Telehealth: Payer: Self-pay | Admitting: Family Medicine

## 2018-07-15 NOTE — Telephone Encounter (Signed)
I called the patient to schedule AWV with [facility].  There was no answer and no option to leave a message. VDM (DD) °

## 2018-08-18 ENCOUNTER — Other Ambulatory Visit: Payer: Self-pay | Admitting: Family Medicine

## 2018-11-15 ENCOUNTER — Encounter: Payer: Self-pay | Admitting: Family Medicine

## 2018-11-15 ENCOUNTER — Ambulatory Visit (INDEPENDENT_AMBULATORY_CARE_PROVIDER_SITE_OTHER): Payer: Medicare Other | Admitting: Family Medicine

## 2018-11-15 ENCOUNTER — Other Ambulatory Visit: Payer: Self-pay

## 2018-11-15 VITALS — BP 122/62 | HR 43 | Temp 98.6°F | Resp 16 | Ht 68.0 in | Wt 160.0 lb

## 2018-11-15 DIAGNOSIS — N529 Male erectile dysfunction, unspecified: Secondary | ICD-10-CM

## 2018-11-15 DIAGNOSIS — Z125 Encounter for screening for malignant neoplasm of prostate: Secondary | ICD-10-CM

## 2018-11-15 DIAGNOSIS — R202 Paresthesia of skin: Secondary | ICD-10-CM

## 2018-11-15 DIAGNOSIS — R200 Anesthesia of skin: Secondary | ICD-10-CM | POA: Diagnosis not present

## 2018-11-15 DIAGNOSIS — Z0000 Encounter for general adult medical examination without abnormal findings: Secondary | ICD-10-CM

## 2018-11-15 DIAGNOSIS — I10 Essential (primary) hypertension: Secondary | ICD-10-CM | POA: Diagnosis not present

## 2018-11-15 MED ORDER — TADALAFIL 10 MG PO TABS: 10.0000 mg | ORAL_TABLET | ORAL | 5 refills | Status: AC | PRN

## 2018-11-15 MED ORDER — TADALAFIL 20 MG PO TABS: 10.0000 mg | ORAL_TABLET | ORAL | 3 refills | Status: DC | PRN

## 2018-11-15 NOTE — Patient Instructions (Addendum)
.   Please bring all of your medications to every appointment so we can make sure that our medication list is the same as yours.    Check with your pharmacist at [facility] to see if you have had both doses of Shingrx (shingles vaccine)

## 2018-11-15 NOTE — Progress Notes (Signed)
Patient: Hunter Fernandez, Male    DOB: 1940/10/27, 78 y.o.   MRN: [id number] Visit Date: 11/15/2018  Today's Provider: Lelon Huh, MD   Chief Complaint  Patient presents with  . Medicare Wellness  . Hypertension  . Hyperglycemia   Subjective:     Annual wellness visit Hunter Fernandez is a 78 y.o. male. He feels well. He reports no regular exercising. He reports he is sleeping fairly well.  -----------------------------------------------------------   Hypertension, follow-up:  BP Readings from Last 3 Encounters:  11/15/18 122/62  06/13/18 117/70  10/30/16 128/74    He was last seen for hypertension 2 years ago.  BP at that visit was 128/74. Management since that visit includes no changes. He reports good compliance with treatment. He is not having side effects.  He is not exercising. He is not adherent to low salt diet.   Outside blood pressures are not being checked. He is experiencing none.  Patient denies chest pain, chest pressure/discomfort, claudication, dyspnea, exertional chest pressure/discomfort, fatigue, irregular heart beat, lower extremity edema, near-syncope, orthopnea, palpitations, paroxysmal nocturnal dyspnea, syncope and tachypnea.   Cardiovascular risk factors include advanced age (older than 54 for men, 68 for women), hypertension and male gender.  Use of agents associated with hypertension: NSAIDS.     Weight trend: fluctuating a bit Wt Readings from Last 3 Encounters:  11/15/18 160 lb (72.6 kg)  06/13/18 170 lb (77.1 kg)  10/30/16 178 lb (80.7 kg)    Current diet: well balanced  ------------------------------------------------------------------------  Prediabetes, Follow-up:   Lab Results  Component Value Date   HGBA1C 5.9 (H) 10/30/2016   HGBA1C 5.9 (H) 01/11/2015   HGBA1C 6.0 12/29/2013   GLUCOSE 104 (H) 10/30/2016   GLUCOSE 107 (H) 01/11/2015    Last seen for for this 2 years ago.  Management since that visit includes no  changes. Current symptoms include none and have been stable.  Weight trend: fluctuating a bit Prior visit with dietician: no Current diet: well balanced Current exercise: none  Pertinent Labs:    Component Value Date/Time   CHOL 188 10/30/2016 1501   TRIG 152 (H) 10/30/2016 1501   CHOLHDL 3.7 10/30/2016 1501   CREATININE 1.27 10/30/2016 1501    Wt Readings from Last 3 Encounters:  11/15/18 160 lb (72.6 kg)  06/13/18 170 lb (77.1 kg)  10/30/16 178 lb (80.7 kg)    Review of Systems  Constitutional: Negative for appetite change, chills, fatigue and fever.  HENT: Negative for congestion, ear pain, hearing loss, nosebleeds and trouble swallowing.   Eyes: Negative for pain and visual disturbance.  Respiratory: Negative for cough, chest tightness and shortness of breath.   Cardiovascular: Negative for chest pain, palpitations and leg swelling.  Gastrointestinal: Negative for abdominal pain, blood in stool, constipation, diarrhea, nausea and vomiting.  Endocrine: Negative for polydipsia, polyphagia and polyuria.  Genitourinary: Negative for dysuria and flank pain.  Musculoskeletal: Negative for arthralgias, back pain, joint swelling, myalgias and neck stiffness.  Skin: Negative for color change, rash and wound.  Neurological: Negative for dizziness, tremors, seizures, speech difficulty, weakness, light-headedness and headaches.  Psychiatric/Behavioral: Negative for behavioral problems, confusion, decreased concentration, dysphoric mood and sleep disturbance. The patient is not nervous/anxious.   All other systems reviewed and are negative.   Social History   Socioeconomic History  . Marital status: Married    Spouse name: Not on file  . Number of children: 1  . Years of education: Not on file  .  Highest education level: Not on file  Occupational History  . Occupation: Retired  Scientific laboratory technician  . Financial resource strain: Not on file  . Food insecurity    Worry: Not on file     Inability: Not on file  . Transportation needs    Medical: Not on file    Non-medical: Not on file  Tobacco Use  . Smoking status: Former Research scientist (life sciences)  . Smokeless tobacco: Never Used  . Tobacco comment: quit smoking in 1991  Substance and Sexual Activity  . Alcohol use: Yes    Comment: occasional alcohol use  . Drug use: No  . Sexual activity: Not on file  Lifestyle  . Physical activity    Days per week: Not on file    Minutes per session: Not on file  . Stress: Not on file  Relationships  . Social Herbalist on phone: Not on file    Gets together: Not on file    Attends religious service: Not on file    Active member of club or organization: Not on file    Attends meetings of clubs or organizations: Not on file    Relationship status: Not on file  . Intimate partner violence    Fear of current or ex partner: Not on file    Emotionally abused: Not on file    Physically abused: Not on file    Forced sexual activity: Not on file  Other Topics Concern  . Not on file  Social History Narrative  . Not on file    Past Medical History:  Diagnosis Date  . Allergy    allergic Rhinitis  . Erectile dysfunction   . Hypertension    essential  . Insomnia      Patient Active Problem List   Diagnosis Date Noted  . Allergic rhinitis 01/09/2015  . 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 Insomnia 01/09/2015  . Pre-diabetes 01/09/2015  . Essential (primary) hypertension 12/06/2009    Past Surgical History:  Procedure Laterality Date  . TONSILLECTOMY AND ADENOIDECTOMY  1940  . VASECTOMY  1971    His family history includes CVA in his sister; Dementia in his mother.   Current Outpatient Medications:  .  ALPRAZolam (XANAX) 0.5 MG tablet, TAKE 1 TABLET BY MOUTH NIGHTLY, Disp: 90 tablet, Rfl: 1 .  aspirin EC 81 MG tablet, Take 1 tablet by mouth daily. 1 Every day, Disp: , Rfl:  .  diphenhydrAMINE (BENADRYL) 25  MG tablet, Take 25 mg by mouth 2 (two) times daily., Disp: , Rfl:  .  IBUPROFEN PO, Take by mouth as needed., Disp: , Rfl:  .  lisinopril (PRINIVIL,ZESTRIL) 20 MG tablet, TAKE ONE TABLET BY MOUTH EVERY DAY, Disp: 90 tablet, Rfl: 4 .  metoprolol tartrate (LOPRESSOR) 25 MG tablet, Take 0.5 tablets (12.5 mg total) by mouth daily., Disp: 45 tablet, Rfl: 3 .  Omega-3 Fatty Acids (FISH OIL PO), Take 1 capsule by mouth daily., Disp: , Rfl:  .  sildenafil (REVATIO) 20 MG tablet, Take 2-3 tablets by mouth as needed. No more than 5 in a day, Disp: , Rfl:  .  triamterene-hydrochlorothiazide (MAXZIDE) 75-50 MG tablet, TAKE ONE TABLET BY MOUTH EVERY DAY, Disp: 90 tablet, Rfl: 3  Patient Care Team: Birdie Sons, MD as PCP - General (Family Medicine)    Objective:    Vitals: BP 122/62 (BP Location: Left Arm, Patient Position: Sitting, Cuff Size: Normal)  Pulse (!) 43   Temp 98.6 F (37 C) (Oral)   Resp 16   Ht 5\' 8"  (1.727 m)   Wt 160 lb (72.6 kg)   SpO2 99% Comment: room air  BMI 24.33 kg/m   Physical Exam   General Appearance:    Alert, cooperative, no distress, appears stated age  Head:    Normocephalic, without obvious abnormality, atraumatic  Eyes:    PERRL, conjunctiva/corneas clear, EOM's intact, fundi    benign, both eyes       Ears:    Normal TM's and external ear canals, both ears  Nose:   Nares normal, septum midline, mucosa normal, no drainage   or sinus tenderness  Throat:   Lips, mucosa, and tongue normal; teeth and gums normal  Neck:   Supple, symmetrical, trachea midline, no adenopathy;       thyroid:  No enlargement/tenderness/nodules; no carotid   bruit or JVD  Back:     Symmetric, no curvature, ROM normal, no CVA tenderness  Lungs:     Clear to auscultation bilaterally, respirations unlabored  Chest wall:    No tenderness or deformity  Heart:    Regular rate and rhythm, S1 and S2 normal, no murmur, rub   or gallop  Abdomen:     Soft, non-tender, bowel sounds active  all four quadrants,    no masses, no organomegaly  Genitalia:    deferred  Rectal:    deferred  Extremities:   Extremities normal, atraumatic, no cyanosis or edema  Pulses:   2+ and symmetric all extremities  Skin:   Skin color, texture, turgor normal, no rashes or lesions  Lymph nodes:   Cervical, supraclavicular, and axillary nodes normal  Neurologic:   CNII-XII intact. Normal strength, sensation and reflexes      throughout    Activities of Daily Living In your present state of health, do you have any difficulty performing the following activities: 11/15/2018  Hearing? N  Vision? N  Difficulty concentrating or making decisions? N  Walking or climbing stairs? N  Dressing or bathing? N  Doing errands, shopping? N  Some recent data might be hidden    Fall Risk Assessment Fall Risk  11/15/2018 10/30/2016 03/18/2016 01/11/2015  Falls in the past year? 0 No No No  Number falls in past yr: 0 - - -  Injury with Fall? 0 - - -     Depression Screen PHQ 2/9 Scores 11/15/2018 10/30/2016 03/18/2016 01/11/2015  PHQ - 2 Score 0 0 0 0  PHQ- 9 Score 0 0 - -   Cognitive Testing - 6-CIT  Correct? Score   What year is it? yes 0 0 or 4  What month is it? yes 0 0 or 3  Memorize:    Pia Mau,  40,  [place],      What time is it? (within 1 hour) yes 0 0 or 3  Count backwards from 20 yes 0 0, 2, or 4  Name the months of the year yes 0 0, 2, or 4  Repeat name & address above no 4 0, 2, 4, 6, 8, or 10       TOTAL SCORE  4/28   Interpretation:  Normal  Normal (0-7) Abnormal (8-28)   No flowsheet data found.    Assessment & Plan:     Annual Wellness Visit  Reviewed patient's Family Medical History Reviewed and updated list of patient's medical providers Assessment of cognitive impairment was  done Assessed patient's functional ability Established a written schedule for health screening [place] Completed and Reviewed  Exercise Activities and Dietary  recommendations Goals   None     Immunization History  Administered Date(s) Administered  . Influenza, High Dose Seasonal PF 04/07/2018  . Pneumococcal Conjugate-13 12/29/2013  . Pneumococcal Polysaccharide-23 09/28/2012  . Zoster 02/26/2015  . Zoster Recombinat (Shingrix) 08/31/2017    Health Maintenance  Topic Date Due  . TETANUS/TDAP  10/29/1957  . INFLUENZA VACCINE  12/24/2018  . PNA vac Low Risk Adult  Completed     Discussed health benefits of physical activity, and encouraged him to engage in regular exercise appropriate for his age and condition.    ------------------------------------------------------------------------------------------------------------ 1. Medicare annual wellness visit, subsequent   2. Numbness and tingling of both feet  - Vitamin B12  3. Prostate cancer screening  - PSA  4. Essential (primary) hypertension Well controlled.  Continue current medications.  - Comprehensive metabolic panel - Lipid panel - TSH  5. ED (erectile dysfunction) of organic origin He would like to try something different that sildenafil to see if it works better.  - tadalafil (CIALIS) 10 MG tablet; Take 1 tablet (10 mg total) by mouth every other day as needed for erectile dysfunction.  Dispense: 20 tablet; Refill: [place], MD  [facility]

## 2018-11-16 ENCOUNTER — Telehealth: Payer: Self-pay

## 2018-11-16 LAB — COMPREHENSIVE METABOLIC PANEL
ALT: 21 IU/L (ref 0–44)
AST: 15 IU/L (ref 0–40)
Albumin/Globulin Ratio: 1.8 (ref 1.2–2.2)
Albumin: 4.5 g/dL (ref 3.7–4.7)
Alkaline Phosphatase: 55 IU/L (ref 39–117)
BUN/Creatinine Ratio: 13 (ref 10–24)
BUN: 17 mg/dL (ref 8–27)
Bilirubin Total: 0.8 mg/dL (ref 0.0–1.2)
CO2: 24 mmol/L (ref 20–29)
Calcium: 10.5 mg/dL — ABNORMAL HIGH (ref 8.6–10.2)
Chloride: 97 mmol/L (ref 96–106)
Creatinine, Ser: 1.33 mg/dL — ABNORMAL HIGH (ref 0.76–1.27)
GFR calc Af Amer: 58 mL/min/{1.73_m2} — ABNORMAL LOW (ref >59)
GFR calc non Af Amer: 50 mL/min/{1.73_m2} — ABNORMAL LOW (ref >59)
Globulin, Total: 2.5 g/dL (ref 1.5–4.5)
Glucose: 103 mg/dL — ABNORMAL HIGH (ref 65–99)
Potassium: 3.8 mmol/L (ref 3.5–5.2)
Sodium: 137 mmol/L (ref 134–144)
Total Protein: 7 g/dL (ref 6.0–8.5)

## 2018-11-16 LAB — LIPID PANEL
Chol/HDL Ratio: 3.6 ratio (ref 0.0–5.0)
Cholesterol, Total: 174 mg/dL (ref 100–199)
HDL: 49 mg/dL (ref >39)
LDL Calculated: 102 mg/dL — ABNORMAL HIGH (ref 0–99)
Triglycerides: 114 mg/dL (ref 0–149)
VLDL Cholesterol Cal: 23 mg/dL (ref 5–40)

## 2018-11-16 LAB — VITAMIN B12: Vitamin B-12: 325 pg/mL (ref 232–1245)

## 2018-11-16 LAB — TSH: TSH: 1.08 u[IU]/mL (ref 0.450–4.500)

## 2018-11-16 LAB — PSA: Prostate Specific Ag, Serum: 4.3 ng/mL — ABNORMAL HIGH (ref 0.0–4.0)

## 2018-11-16 NOTE — Telephone Encounter (Signed)
-----   Message from Birdie Sons, MD sent at 11/16/2018  7:52 AM EDT ----- PSA is a little high. Is also a little dehydrated, need to drink more water.  Need to recheck PSA in one month. Will call at end of July when time to recheck.

## 2018-11-16 NOTE — Telephone Encounter (Signed)
Pt advised.   Thanks,   -Laura  

## 2018-12-12 ENCOUNTER — Other Ambulatory Visit: Payer: Self-pay | Admitting: Family Medicine

## 2018-12-12 MED ORDER — SILDENAFIL CITRATE 20 MG PO TABS: 40.0000 mg | ORAL_TABLET | ORAL | 3 refills | Status: DC | PRN

## 2018-12-12 NOTE — Telephone Encounter (Signed)
[facility] faxed refill request for the following medications:  sildenafil (REVATIO) 20 MG tablet   Please advise. Thanks TNP

## 2018-12-18 ENCOUNTER — Telehealth: Payer: Self-pay | Admitting: Family Medicine

## 2018-12-18 DIAGNOSIS — R972 Elevated prostate specific antigen [PSA]: Secondary | ICD-10-CM

## 2018-12-18 NOTE — Telephone Encounter (Signed)
-----   Message from Birdie Sons, MD sent at 12/14/2018 10:06 AM EDT ----- Regarding: FW: need to recheck psa at end of july  ----- Message ----- From: Birdie Sons, MD Sent: 12/14/2018 To: Birdie Sons, MD Subject: need to recheck psa at end of july

## 2018-12-18 NOTE — Telephone Encounter (Signed)
Please advise pt is time to recheck psa since it was elevated in June. Have also ordered repeat renal functions. Please print and leave order at lab. Does not need to fast.

## 2018-12-19 NOTE — Telephone Encounter (Signed)
Patient advised. Lab order placed up front.

## 2018-12-21 DIAGNOSIS — R972 Elevated prostate specific antigen [PSA]: Secondary | ICD-10-CM | POA: Diagnosis not present

## 2018-12-22 LAB — RENAL FUNCTION PANEL
Albumin: 4.1 g/dL (ref 3.7–4.7)
BUN/Creatinine Ratio: 12 (ref 10–24)
BUN: 15 mg/dL (ref 8–27)
CO2: 24 mmol/L (ref 20–29)
Calcium: 10 mg/dL (ref 8.6–10.2)
Chloride: 100 mmol/L (ref 96–106)
Creatinine, Ser: 1.3 mg/dL — ABNORMAL HIGH (ref 0.76–1.27)
GFR calc Af Amer: 60 mL/min/{1.73_m2} (ref >59)
GFR calc non Af Amer: 52 mL/min/{1.73_m2} — ABNORMAL LOW (ref >59)
Glucose: 115 mg/dL — ABNORMAL HIGH (ref 65–99)
Phosphorus: 3.2 mg/dL (ref 2.8–4.1)
Potassium: 4.4 mmol/L (ref 3.5–5.2)
Sodium: 140 mmol/L (ref 134–144)

## 2018-12-22 LAB — PSA TOTAL (REFLEX TO FREE): Prostate Specific Ag, Serum: 4.3 ng/mL — ABNORMAL HIGH (ref 0.0–4.0)

## 2018-12-22 LAB — FPSA% REFLEX
% FREE PSA: 17.9 %
PSA, FREE: 0.77 ng/mL

## 2018-12-23 ENCOUNTER — Telehealth: Payer: Self-pay

## 2018-12-23 NOTE — Telephone Encounter (Signed)
-----   Message from Birdie Sons, MD sent at 12/22/2018  8:34 AM EDT ----- PSA is stable. It is not trending up which is a good sign. We should recheck in 6 months. Kidney functions better, back in normal range. Be sure to drink plenty of water. Will contact him to check PSA in January.

## 2018-12-23 NOTE — Telephone Encounter (Signed)
Pt advised.   Thanks,   -Davari Lopes  

## 2019-01-11 ENCOUNTER — Telehealth: Payer: Self-pay | Admitting: Family Medicine

## 2019-01-11 NOTE — Telephone Encounter (Signed)
Please review.  Does pt need an appointment or virtual visit?  Thanks,   -Mickel Baas

## 2019-01-11 NOTE — Telephone Encounter (Signed)
yes

## 2019-01-11 NOTE — Telephone Encounter (Signed)
Pt's girlfriend called with a list of problems that she thinks pt needs to be seen for.  He had a physical in June but she thinks he needs to be seen.  She said his HR runs 48 to 71 and his pulse ox is around 93 to 98.  He gets SOB. He has a congestive cough.  She said she thinks he needs a stress test. Groin pain left side and  some left numbness in his leg. She said he has many moles on his back that she wants looked at.  Please advise  CB#  [phone]  Thanks Con Memos

## 2019-01-12 NOTE — Telephone Encounter (Signed)
Patient advised as below.  

## 2019-01-12 NOTE — Telephone Encounter (Signed)
Is sounds like he is going to need labs and probably and EKG so it needs to be in the office.

## 2019-01-12 NOTE — Telephone Encounter (Signed)
Patient called back to check on this message. Please clarify if patient's office visit should be "in office/ in person," or if it should be a virtual visit.

## 2019-01-16 ENCOUNTER — Ambulatory Visit (INDEPENDENT_AMBULATORY_CARE_PROVIDER_SITE_OTHER): Payer: Medicare Other | Admitting: Family Medicine

## 2019-01-16 ENCOUNTER — Other Ambulatory Visit: Payer: Self-pay

## 2019-01-16 ENCOUNTER — Encounter: Payer: Self-pay | Admitting: Family Medicine

## 2019-01-16 VITALS — BP 120/66 | HR 48 | Temp 97.1°F | Resp 16 | Wt 159.0 lb

## 2019-01-16 DIAGNOSIS — Z23 Encounter for immunization: Secondary | ICD-10-CM | POA: Diagnosis not present

## 2019-01-16 DIAGNOSIS — R001 Bradycardia, unspecified: Secondary | ICD-10-CM | POA: Diagnosis not present

## 2019-01-16 DIAGNOSIS — L821 Other seborrheic keratosis: Secondary | ICD-10-CM

## 2019-01-16 DIAGNOSIS — Z1211 Encounter for screening for malignant neoplasm of colon: Secondary | ICD-10-CM | POA: Diagnosis not present

## 2019-01-16 DIAGNOSIS — R0609 Other forms of dyspnea: Secondary | ICD-10-CM | POA: Diagnosis not present

## 2019-01-16 DIAGNOSIS — R0600 Dyspnea, unspecified: Secondary | ICD-10-CM

## 2019-01-16 DIAGNOSIS — D229 Melanocytic nevi, unspecified: Secondary | ICD-10-CM

## 2019-01-16 MED ORDER — SILDENAFIL CITRATE 100 MG PO TABS: 100.0000 mg | ORAL_TABLET | Freq: Every day | ORAL | 11 refills | Status: AC | PRN

## 2019-01-16 MED ORDER — SILDENAFIL CITRATE 100 MG PO TABS: 100.0000 mg | ORAL_TABLET | Freq: Every day | ORAL | 11 refills | Status: DC | PRN

## 2019-01-16 NOTE — Patient Instructions (Signed)
.   Please review the attached list of medications and notify my office if there are any errors.   . Please bring all of your medications to every appointment so we can make sure that our medication list is the same as yours.   . It is especially important to get the annual flu vaccine this year. If you haven't had it already, please go to your pharmacy or call the office as soon as possible to schedule you flu shot.  

## 2019-01-16 NOTE — Addendum Note (Signed)
Addended by: Birdie Sons on: 01/16/2019 04:55 PM   Modules accepted: Orders

## 2019-01-16 NOTE — Addendum Note (Signed)
Addended by: Randal Buba on: 01/16/2019 03:27 PM   Modules accepted: Orders

## 2019-01-16 NOTE — Progress Notes (Signed)
Patient: Hunter Fernandez Male    DOB: 01-05-1941   78 y.o.   MRN: [id number] Visit Date: 01/16/2019  Today's Provider: Lelon Huh, MD   Chief Complaint  Patient presents with  . Bradycardia   Subjective:     HPI Bradycardia: Patient comes in today reporting that his heart rate has been averaging 40-50. He also complains of dyspnea on exertion. Patient is unsure how long these symptoms have been going on, but reports this is new for him. He states his girlfriend was concerned he may have some heart trouble.  Pulse Readings from Last 3 Encounters:  01/16/19 (!) 48  11/15/18 (!) 43  06/13/18 (!) 52  States heart rate varies a lot at home, sometimes down to 41. No palpitations.   He also has several moles on his back he girlfriend was concerned about.   He also states he needs a higher dose of ED medications. Alternates between 10mg  Cialis and 20mg  sildenafil tablets up to 5 in a day   No Known Allergies   Current Outpatient Medications:  .  ALPRAZolam (XANAX) 0.5 MG tablet, TAKE 1 TABLET BY MOUTH NIGHTLY, Disp: 90 tablet, Rfl: 1 .  aspirin EC 81 MG tablet, Take 1 tablet by mouth daily. 1 Every day, Disp: , Rfl:  .  IBUPROFEN PO, Take by mouth as needed., Disp: , Rfl:  .  lisinopril (PRINIVIL,ZESTRIL) 20 MG tablet, TAKE ONE TABLET BY MOUTH EVERY DAY, Disp: 90 tablet, Rfl: 4 .  metoprolol tartrate (LOPRESSOR) 25 MG tablet, Take 0.5 tablets (12.5 mg total) by mouth daily., Disp: 45 tablet, Rfl: 3 .  Omega-3 Fatty Acids (FISH OIL PO), Take 1 capsule by mouth daily., Disp: , Rfl:  .  sildenafil (REVATIO) 20 MG tablet, Take 2-3 tablets (40-60 mg total) by mouth as needed. No more than 5 in a day, Disp: 30 tablet, Rfl: 3 .  tadalafil (CIALIS) 10 MG tablet, Take 1 tablet (10 mg total) by mouth every other day as needed for erectile dysfunction., Disp: 20 tablet, Rfl: 5 .  triamterene-hydrochlorothiazide (MAXZIDE) 75-50 MG tablet, TAKE ONE TABLET BY MOUTH EVERY DAY (Patient taking  differently: 0.5 tablets. ), Disp: 90 tablet, Rfl: 3 .  diphenhydrAMINE (BENADRYL) 25 MG tablet, Take 25 mg by mouth 2 (two) times daily., Disp: , Rfl:   Review of Systems  Constitutional: Negative for appetite change, chills and fever.  Respiratory: Positive for shortness of breath (during exertion). Negative for chest tightness and wheezing.   Cardiovascular: Negative for chest pain and palpitations.       Low heart rate  Gastrointestinal: Negative for abdominal pain, nausea and vomiting.  Musculoskeletal: Positive for myalgias (left leg pain).  Skin:       Excessive moles on his back    Social History   Tobacco Use  . Smoking status: Former Research scientist (life sciences)  . Smokeless tobacco: Never Used  . Tobacco comment: quit smoking in 1991  Substance Use Topics  . Alcohol use: Yes    Comment: occasional alcohol use      Objective:   BP 120/66 (BP Location: Left Arm, Patient Position: Sitting, Cuff Size: Large)   Pulse (!) 48   Temp (!) 97.1 F (36.2 C) (Temporal)   Resp 16   Wt 159 lb (72.1 kg)   SpO2 98% Comment: room air  BMI 24.18 kg/m  Vitals:   01/16/19 1407  BP: 120/66  Pulse: (!) 48  Resp: 16  Temp: (!) 97.1 F (36.2  C)  TempSrc: Temporal  SpO2: 98%  Weight: 159 lb (72.1 kg)     Physical Exam   General Appearance:    Alert, cooperative, no distress  Eyes:    PERRL, conjunctiva/corneas clear, EOM's intact       Lungs:     Clear to auscultation bilaterally, respirations unlabored  Heart:    Bradycardic. Normal rhythm. No murmurs, rubs, or gallops.   MS:   All extremities are intact.   Neurologic:   Awake, alert, oriented x 3. No apparent focal neurological           defect.   Derm:   Numerous scaly pigemented lesion across back c/w seborrheic keratoses, several darkly pigmented.      Assessment & Plan    1. Bradycardia On very dose  Beta-blocker which he has been on for many years, but bradycardia is progressing. Unclear if this is related to dyspnea. His GF has  him concerned about this and would like further evaluation.  - EKG 12-Lead - Ambulatory referral to Cardiology  2. Colon cancer screening  - Cologuard  3. Dyspnea on exertion  - Ambulatory referral to Cardiology  4. Seborrheic keratosis   5. Atypical mole  - Ambulatory referral to Dermatology  ED - sildenafil (VIAGRA) 100 MG tablet; Take 1 tablet (100 mg total) by mouth daily as needed for erectile dysfunction.  Dispense: 5 tablet; Refill: [place], MD  [facility]

## 2019-01-19 ENCOUNTER — Ambulatory Visit (INDEPENDENT_AMBULATORY_CARE_PROVIDER_SITE_OTHER): Payer: Medicare Other | Admitting: Internal Medicine

## 2019-01-19 ENCOUNTER — Encounter: Payer: Self-pay | Admitting: Internal Medicine

## 2019-01-19 ENCOUNTER — Other Ambulatory Visit: Payer: Self-pay

## 2019-01-19 VITALS — BP 140/60 | HR 60 | Ht 68.0 in | Wt 154.0 lb

## 2019-01-19 DIAGNOSIS — R0609 Other forms of dyspnea: Secondary | ICD-10-CM | POA: Diagnosis not present

## 2019-01-19 DIAGNOSIS — R001 Bradycardia, unspecified: Secondary | ICD-10-CM | POA: Diagnosis not present

## 2019-01-19 DIAGNOSIS — R0600 Dyspnea, unspecified: Secondary | ICD-10-CM

## 2019-01-19 NOTE — Patient Instructions (Signed)
Medication Instructions:  - Your physician has recommended you make the following change in your medication:   1) STOP lopressor (metoprolol tartrate)  If you need a refill on your cardiac medications before your next appointment, please call your pharmacy.   Lab work: - Your physician recommends that you have lab work today: CBC  If you have labs (blood work) drawn today and your tests are completely normal, you will receive your results only by: Marland Kitchen MyChart Message (if you have MyChart) OR . A paper copy in the mail If you have any lab test that is abnormal or we need to change your treatment, we will call you to review the results.  Testing/Procedures: - none ordered  Follow-Up: At [facility], you and your health needs are our priority.  As part of our continuing mission to provide you with exceptional heart care, we have created designated Provider Care Teams.  These Care Teams include your primary Cardiologist (physician) and Advanced Practice Providers (APPs -  Physician Assistants and Nurse Practitioners) who all work together to provide you with the care you need, when you need it. Marland Kitchen as needed with Dr. Caryl Comes  Any Other Special Instructions Will Be Listed Below (If Applicable). - N/A

## 2019-01-19 NOTE — Progress Notes (Signed)
ELECTROPHYSIOLOGY CONSULT NOTE  Patient ID: Hunter Fernandez, MRN: [id number], DOB/AGE: 80-Sep-1942 80 y.o. Admit date: (Not on file) Date of Consult: 01/19/2019  Primary Physician: Birdie Sons, MD Primary Cardiologist: Hunter Fernandez is a 78 y.o. male who is being seen today for the evaluation of bradycardia at the request of Dr Caryn Section.   Chief Complaint: slow HR    HPI Hunter Fernandez is a 78 y.o. male referred for bradycardia Saw PCP last week with with his girlfriend who used to work at [employer] who expressed concerns of DOE and progressive bradycardia.  The patient himself offers no complaints.  He does acknowledge the correctness of her observations when she says that he has dyspnea.  He is able to walk stairs, and indeed we walked 2 flights of stairs today without shortness of breath.  He notes no change in his exercise tolerance.  Denies chest discomfort.  No lightheadedness or palpitations.  Longstanding hypertension and has been on low-dose metoprolol.  ECG 8/20 Personally reviewed  Sinus @ 46  Review from Epic  HR from 8/16 ECG personally reviewed 8/16 >>Sinus @ 43        Date Cr K Hgb  7/20 1.3 4.4 None in Epic           He has, according to recollection of his daughter, a history of a heart attack cared for at [facility] about 30 years ago. He recalls a cath but not the results PRN  Past Medical History:  Diagnosis Date  . Allergy    allergic Rhinitis  . Erectile dysfunction   . Hypertension    essential  . Insomnia       Surgical History:  Past Surgical History:  Procedure Laterality Date  . TONSILLECTOMY AND ADENOIDECTOMY  1940  . VASECTOMY  1971     Home Meds: Prior to Admission medications   Medication Sig Start Date End Date Taking? Authorizing Provider  ALPRAZolam Duanne Moron) 0.5 MG tablet TAKE 1 TABLET BY MOUTH NIGHTLY 08/18/18  Yes Birdie Sons, MD  aspirin EC 81 MG tablet Take 1 tablet by mouth daily. 1 Every day 12/06/09  Yes [provider]  diphenhydrAMINE (BENADRYL) 25 MG tablet Take 25 mg by mouth 2 (two) times daily.   Yes [provider]  IBUPROFEN PO Take by mouth as needed.   Yes [provider]  lisinopril (PRINIVIL,ZESTRIL) 20 MG tablet TAKE ONE TABLET BY MOUTH EVERY DAY 08/18/18  Yes Birdie Sons, MD  metoprolol tartrate (LOPRESSOR) 25 MG tablet Take 0.5 tablets (12.5 mg total) by mouth daily. 03/03/18  Yes Birdie Sons, MD  Omega-3 Fatty Acids (FISH OIL PO) Take 1 capsule by mouth daily.   Yes [provider]  sildenafil (VIAGRA) 100 MG tablet Take 1 tablet (100 mg total) by mouth daily as needed for erectile dysfunction. 01/16/19  Yes Birdie Sons, MD  tadalafil (CIALIS) 10 MG tablet Take 1 tablet (10 mg total) by mouth every other day as needed for erectile dysfunction. 11/15/18  Yes Birdie Sons, MD  triamterene-hydrochlorothiazide (MAXZIDE) 75-50 MG tablet TAKE ONE TABLET BY MOUTH EVERY DAY Patient taking differently: 0.5 tablets.  03/17/18  Yes Birdie Sons, MD     Allergies: No Known Allergies  Social History   Socioeconomic History  . Marital status: Married    Spouse name: Not on file  . Number of children: 1  . Years of education: Not on file  . Highest education  level: Not on file  Occupational History  . Occupation: Retired  Scientific laboratory technician  . Financial resource strain: Not on file  . Food insecurity    Worry: Not on file    Inability: Not on file  . Transportation needs    Medical: Not on file    Non-medical: Not on file  Tobacco Use  . Smoking status: Former Research scientist (life sciences)  . Smokeless tobacco: Never Used  . Tobacco comment: quit smoking in 1991  Substance and Sexual Activity  . Alcohol use: Yes    Comment: occasional alcohol use  . Drug use: No  . Sexual activity: Not on file  Lifestyle  . Physical activity    Days per week: Not on file    Minutes per session: Not on file  . Stress: Not on file  Relationships  . Social Product manager on phone: Not on file    Gets together: Not on file    Attends religious service: Not on file    Active member of club or organization: Not on file    Attends meetings of clubs or organizations: Not on file    Relationship status: Not on file  . Intimate partner violence    Fear of current or ex partner: Not on file    Emotionally abused: Not on file    Physically abused: Not on file    Forced sexual activity: Not on file  Other Topics Concern  . Not on file  Social History Narrative  . Not on file     Family History  Problem Relation Age of Onset  . Dementia Mother   . CVA Sister      ROS:  Please see the history of present illness.     All other systems reviewed and negative.    Physical Exam: Blood pressure 140/60, pulse 60, height 5\' 8"  (1.727 m), weight 154 lb (69.9 kg), SpO2 98 %. General: Well developed, well nourished male in no acute distress. Head: Normocephalic, atraumatic, sclera non-icteric, no xanthomas, nares are without discharge. EENT: normal Lymph Nodes:  none Back: without scoliosis/kyphosis so so so it was, no CVA tendersness Neck: Negative for carotid bruits. JVD not elevated. Lungs: Clear bilaterally to auscultation without wheezes, rales, or rhonchi. Breathing is unlabored. Heart: slow but RRR with S1 S2. No  murmur , rubs, or gallops appreciated. Abdomen: Soft, non-tender, non-distended with normoactive bowel sounds. No hepatomegaly. No rebound/guarding. No obvious abdominal masses. Msk:  Strength and tone appear normal for age. Extremities: No clubbing or cyanosis. No edema.  Distal pedal pulses are 2+ and equal bilaterally. Skin: Warm and Dry Neuro: Alert and oriented X 3. CN III-XII intact Grossly normal sensory and motor function . Psych:  Responds to questions appropriately with a normal affect.      Labs: Cardiac Enzymes No results for input(s): CKTOTAL, CKMB, TROPONINI in the last 72 hours. CBC No results found for: WBC, HGB, HCT,  MCV, PLT PROTIME: No results for input(s): LABPROT, INR in the last 72 hours. Chemistry No results for input(s): NA, K, CL, CO2, BUN, CREATININE, CALCIUM, PROT, BILITOT, ALKPHOS, ALT, AST, GLUCOSE in the last 168 hours.  Invalid input(s): LABALBU Lipids Lab Results  Component Value Date   CHOL 174 11/15/2018   HDL 49 11/15/2018   LDLCALC 102 (H) 11/15/2018   TRIG 114 11/15/2018   BNP No results found for: PROBNP Thyroid Function Tests: No results for input(s): TSH, T4TOTAL, T3FREE, THYROIDAB in the last 72 hours.  Invalid input(s): FREET3    Miscellaneous No results found for: DDIMER  Radiology/Studies:  No results found.  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  He is able to walk stairs without dyspnea.  No clearly attributable symptoms.  Hence would follow and do nothing else, specifically there is no indication for pacing.  I have elected to stop his low-dose metoprolol.  With his reports of dyspnea although, not evident, will check a CBC  We will attempt to obtain the records from [facility].  In the event that he did have coronary artery disease and a myocardial infarction, aspirin is appropriate as would be concomitant statin therapy.  However, if he did not, we could probably stop his aspirin.  We will plan to follow the patient up as needed.  Otherwise, will wait for him or Dr. Caryn Section to let us know if there are symptoms attributable to his bradycardia   Virl Axe

## 2019-01-20 LAB — CBC WITH DIFFERENTIAL/PLATELET
Basophils Absolute: 0 10*3/uL (ref 0.0–0.2)
Basos: 1 %
EOS (ABSOLUTE): 0.1 10*3/uL (ref 0.0–0.4)
Eos: 2 %
Hematocrit: 46.7 % (ref 37.5–51.0)
Hemoglobin: 15.9 g/dL (ref 13.0–17.7)
Immature Grans (Abs): 0 10*3/uL (ref 0.0–0.1)
Immature Granulocytes: 0 %
Lymphocytes Absolute: 1.1 10*3/uL (ref 0.7–3.1)
Lymphs: 24 %
MCH: 32.4 pg (ref 26.6–33.0)
MCHC: 34 g/dL (ref 31.5–35.7)
MCV: 95 fL (ref 79–97)
Monocytes Absolute: 0.5 10*3/uL (ref 0.1–0.9)
Monocytes: 12 %
Neutrophils Absolute: 2.7 10*3/uL (ref 1.4–7.0)
Neutrophils: 61 %
Platelets: 264 10*3/uL (ref 150–450)
RBC: 4.91 x10E6/uL (ref 4.14–5.80)
RDW: 11.7 % (ref 11.6–15.4)
WBC: 4.4 10*3/uL (ref 3.4–10.8)

## 2019-01-27 DIAGNOSIS — Z1211 Encounter for screening for malignant neoplasm of colon: Secondary | ICD-10-CM | POA: Diagnosis not present

## 2019-01-27 LAB — COLOGUARD

## 2019-02-01 DIAGNOSIS — H524 Presbyopia: Secondary | ICD-10-CM | POA: Diagnosis not present

## 2019-02-01 DIAGNOSIS — L720 Epidermal cyst: Secondary | ICD-10-CM | POA: Diagnosis not present

## 2019-02-01 DIAGNOSIS — X32XXXA Exposure to sunlight, initial encounter: Secondary | ICD-10-CM | POA: Diagnosis not present

## 2019-02-01 DIAGNOSIS — H5213 Myopia, bilateral: Secondary | ICD-10-CM | POA: Diagnosis not present

## 2019-02-01 DIAGNOSIS — L570 Actinic keratosis: Secondary | ICD-10-CM | POA: Diagnosis not present

## 2019-02-01 DIAGNOSIS — D225 Melanocytic nevi of trunk: Secondary | ICD-10-CM | POA: Diagnosis not present

## 2019-02-14 ENCOUNTER — Encounter: Payer: Self-pay | Admitting: Internal Medicine

## 2019-02-14 NOTE — Progress Notes (Unsigned)
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

## 2019-02-22 ENCOUNTER — Other Ambulatory Visit: Payer: Self-pay | Admitting: Family Medicine

## 2019-03-31 ENCOUNTER — Telehealth: Payer: Self-pay | Admitting: Internal Medicine

## 2019-03-31 NOTE — Telephone Encounter (Signed)
I spoke with the patient's wife, Hunter Fernandez (OK per DPR) to advise her Dr. Caryl Comes received Mr. Hunter Fernandez records from ~30 years ago at [facility]. 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  I have also advised that per Dr. Caryl Comes, since there is no CAD noted, the patient: 1) may stop aspirin 2) does not require a statin medication at this time.  Hunter Fernandez voices understanding of the above recommendations.  She also states she is concerned as the patient is still bradycardiac at times with heart rates in the 40's. However, he did not completely stop his metoprolol as recommended by Dr. Caryl Comes at his office visit on 01/19/19, but just cut the dose in half. I have advised Hunter Fernandez that the patient needs to completely stop his metoprolol as directed by Dr. Caryl Comes back in August. He is asymptomatic with these lower readings per her report.  I have advised that if he does become symptomatic, he will need to be off a beta blocker completely prior to a PPM being placed.  Hunter Fernandez again voices understanding of the above and is agreeable.

## 2019-03-31 NOTE — Addendum Note (Signed)
Addended by: Alvis Lemmings C on: 03/31/2019 02:53 PM   Modules accepted: Orders

## 2019-04-15 DIAGNOSIS — M25561 Pain in right knee: Secondary | ICD-10-CM | POA: Diagnosis not present

## 2019-04-18 DIAGNOSIS — Z20828 Contact with and (suspected) exposure to other viral communicable diseases: Secondary | ICD-10-CM | POA: Diagnosis not present

## 2019-04-24 ENCOUNTER — Other Ambulatory Visit: Payer: Self-pay | Admitting: Family Medicine

## 2019-04-24 MED ORDER — TRIAMTERENE-HCTZ 75-50 MG PO TABS: 1.0000 | ORAL_TABLET | Freq: Every day | ORAL | 0 refills | Status: AC

## 2019-04-24 NOTE — Telephone Encounter (Signed)
[facility] faxed refill request for the following medications:  triamterene-hydrochlorothiazide (MAXZIDE) 75-50 MG tablet  Last Rx: 03/17/2018 LOV: 01/16/2019 Please advise. Thanks TNP

## 2019-05-15 DIAGNOSIS — R05 Cough: Secondary | ICD-10-CM | POA: Diagnosis not present

## 2019-05-15 DIAGNOSIS — M170 Bilateral primary osteoarthritis of knee: Secondary | ICD-10-CM | POA: Diagnosis not present

## 2019-05-31 ENCOUNTER — Telehealth: Payer: Self-pay | Admitting: Family Medicine

## 2019-05-31 DIAGNOSIS — R972 Elevated prostate specific antigen [PSA]: Secondary | ICD-10-CM

## 2019-05-31 NOTE — Telephone Encounter (Signed)
Please advise patient that its time to recheck psa since it was high when checked in july

## 2019-05-31 NOTE — Telephone Encounter (Signed)
Patient advised as below. Patient states he has moved to [place] and has already established care with a local PCP. Patient states he will follow up with his new PCP about the elevated PSA.

## 2019-06-28 ENCOUNTER — Telehealth: Payer: Self-pay

## 2019-06-28 NOTE — Telephone Encounter (Signed)
Copied from [place] [id number]. Topic: General - Call Back - No Documentation >> Jun 28, 2019  3:44 PM Erick Blinks wrote: Reason for CRM: Sharyn Lull from [facility] is requesting a call back on behalf or patient.  Best contact: [phone] ext [phone]

## 2019-06-29 NOTE — Telephone Encounter (Signed)
I called and spoke with Sharyn Lull with [facility]. She is requesting a copy patient patient's last EKG tracing and office note. I spoke with Helene Kelp in Medical records and she received the ROI request and is working on faxing requested information.
